# Patient Record
Sex: Male | Born: 1970 | Race: Black or African American | Hispanic: No | Marital: Married | Smoking: Never smoker
Health system: Southern US, Community
[De-identification: ages and names within clinical notes are randomized; demographics above are authoritative.]

## PROBLEM LIST (undated history)

## (undated) DIAGNOSIS — F988 Other specified behavioral and emotional disorders with onset usually occurring in childhood and adolescence: Secondary | ICD-10-CM

## (undated) DIAGNOSIS — R030 Elevated blood-pressure reading, without diagnosis of hypertension: Secondary | ICD-10-CM

## (undated) DIAGNOSIS — R7303 Prediabetes: Secondary | ICD-10-CM

## (undated) HISTORY — DX: Morbid (severe) obesity due to excess calories: E66.01

## (undated) HISTORY — DX: Other specified behavioral and emotional disorders with onset usually occurring in childhood and adolescence: F98.8

## (undated) HISTORY — DX: Prediabetes: R73.03

## (undated) HISTORY — DX: Elevated blood-pressure reading, without diagnosis of hypertension: R03.0

---

## 1998-09-20 ENCOUNTER — Emergency Department (HOSPITAL_COMMUNITY): Admission: EM | Admit: 1998-09-20 | Discharge: 1998-09-20 | Payer: Self-pay | Admitting: Emergency Medicine

## 1998-09-20 ENCOUNTER — Encounter: Payer: Self-pay | Admitting: Emergency Medicine

## 1999-07-28 ENCOUNTER — Encounter: Payer: Self-pay | Admitting: Emergency Medicine

## 1999-07-28 ENCOUNTER — Emergency Department (HOSPITAL_COMMUNITY): Admission: EM | Admit: 1999-07-28 | Discharge: 1999-07-28 | Payer: Self-pay | Admitting: Emergency Medicine

## 2000-03-30 ENCOUNTER — Emergency Department (HOSPITAL_COMMUNITY): Admission: EM | Admit: 2000-03-30 | Discharge: 2000-03-30 | Payer: Self-pay | Admitting: Emergency Medicine

## 2000-03-30 ENCOUNTER — Encounter: Payer: Self-pay | Admitting: Emergency Medicine

## 2004-05-21 ENCOUNTER — Ambulatory Visit (HOSPITAL_COMMUNITY): Admission: RE | Admit: 2004-05-21 | Discharge: 2004-05-21 | Payer: Self-pay | Admitting: Cardiology

## 2004-05-21 ENCOUNTER — Ambulatory Visit: Payer: Self-pay | Admitting: Cardiology

## 2004-05-22 ENCOUNTER — Ambulatory Visit: Payer: Self-pay

## 2004-05-23 ENCOUNTER — Ambulatory Visit: Payer: Self-pay | Admitting: Cardiology

## 2012-03-17 ENCOUNTER — Encounter (HOSPITAL_COMMUNITY): Payer: Self-pay | Admitting: *Deleted

## 2012-03-17 ENCOUNTER — Emergency Department (HOSPITAL_COMMUNITY)
Admission: EM | Admit: 2012-03-17 | Discharge: 2012-03-17 | Disposition: A | Discharge status: Home / Self Care | Payer: No Typology Code available for payment source | Attending: Pediatric Emergency Medicine | Admitting: Pediatric Emergency Medicine

## 2012-03-17 ENCOUNTER — Emergency Department (HOSPITAL_COMMUNITY): Payer: Self-pay

## 2012-03-17 DIAGNOSIS — S239XXA Sprain of unspecified parts of thorax, initial encounter: Secondary | ICD-10-CM | POA: Insufficient documentation

## 2012-03-17 DIAGNOSIS — S161XXA Strain of muscle, fascia and tendon at neck level, initial encounter: Secondary | ICD-10-CM

## 2012-03-17 DIAGNOSIS — Y92410 Unspecified street and highway as the place of occurrence of the external cause: Secondary | ICD-10-CM | POA: Insufficient documentation

## 2012-03-17 DIAGNOSIS — Y9389 Activity, other specified: Secondary | ICD-10-CM | POA: Insufficient documentation

## 2012-03-17 DIAGNOSIS — S139XXA Sprain of joints and ligaments of unspecified parts of neck, initial encounter: Secondary | ICD-10-CM | POA: Insufficient documentation

## 2012-03-17 DIAGNOSIS — S39012A Strain of muscle, fascia and tendon of lower back, initial encounter: Secondary | ICD-10-CM

## 2012-03-17 MED ORDER — CYCLOBENZAPRINE HCL 10 MG PO TABS: 10.0000 mg | ORAL_TABLET | Freq: Two times a day (BID) | ORAL | Status: DC | PRN

## 2012-03-17 MED ORDER — IBUPROFEN 800 MG PO TABS: 800.0000 mg | ORAL_TABLET | Freq: Three times a day (TID) | ORAL | Status: DC

## 2012-03-17 MED ORDER — IBUPROFEN 800 MG PO TABS
800.0000 mg | ORAL_TABLET | Freq: Once | ORAL | Status: AC
  Administered 2012-03-17: 800 mg via ORAL
  Filled 2012-03-17: qty 1
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
## 2012-03-17 NOTE — ED Provider Notes (Signed)
Medical screening examination/treatment/procedure(s) were performed by non-physician practitioner and as supervising physician I was immediately available for consultation/collaboration.    Felina Tello M Montrice Gracey, MD 03/17/12 2008 

## 2012-06-27 NOTE — ED Notes (Signed)
Patient called requesting additional time for work note and was informed that he would not be able to get any more than 3 consecutive days.

## 2013-10-12 ENCOUNTER — Ambulatory Visit: Payer: 59

## 2013-10-12 ENCOUNTER — Ambulatory Visit (INDEPENDENT_AMBULATORY_CARE_PROVIDER_SITE_OTHER): Payer: 59 | Admitting: Family Medicine

## 2013-10-12 VITALS — BP 154/88 | HR 81 | Temp 97.8°F | Resp 18 | Ht 66.0 in | Wt 234.0 lb

## 2013-10-12 DIAGNOSIS — M549 Dorsalgia, unspecified: Secondary | ICD-10-CM

## 2013-10-12 DIAGNOSIS — S39012A Strain of muscle, fascia and tendon of lower back, initial encounter: Secondary | ICD-10-CM

## 2013-10-12 DIAGNOSIS — S161XXA Strain of muscle, fascia and tendon at neck level, initial encounter: Secondary | ICD-10-CM

## 2013-10-12 DIAGNOSIS — M62830 Muscle spasm of back: Secondary | ICD-10-CM

## 2013-10-12 MED ORDER — PREDNISONE 10 MG PO TABS: ORAL_TABLET | ORAL | Status: DC

## 2013-10-12 MED ORDER — CYCLOBENZAPRINE HCL 10 MG PO TABS: 10.0000 mg | ORAL_TABLET | Freq: Three times a day (TID) | ORAL | Status: DC | PRN
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
## 2015-10-25 IMAGING — CR DG LUMBAR SPINE COMPLETE 4+V
5 series · 5 of 5 positions shown · non-contrast
Comparison: none

[AP]
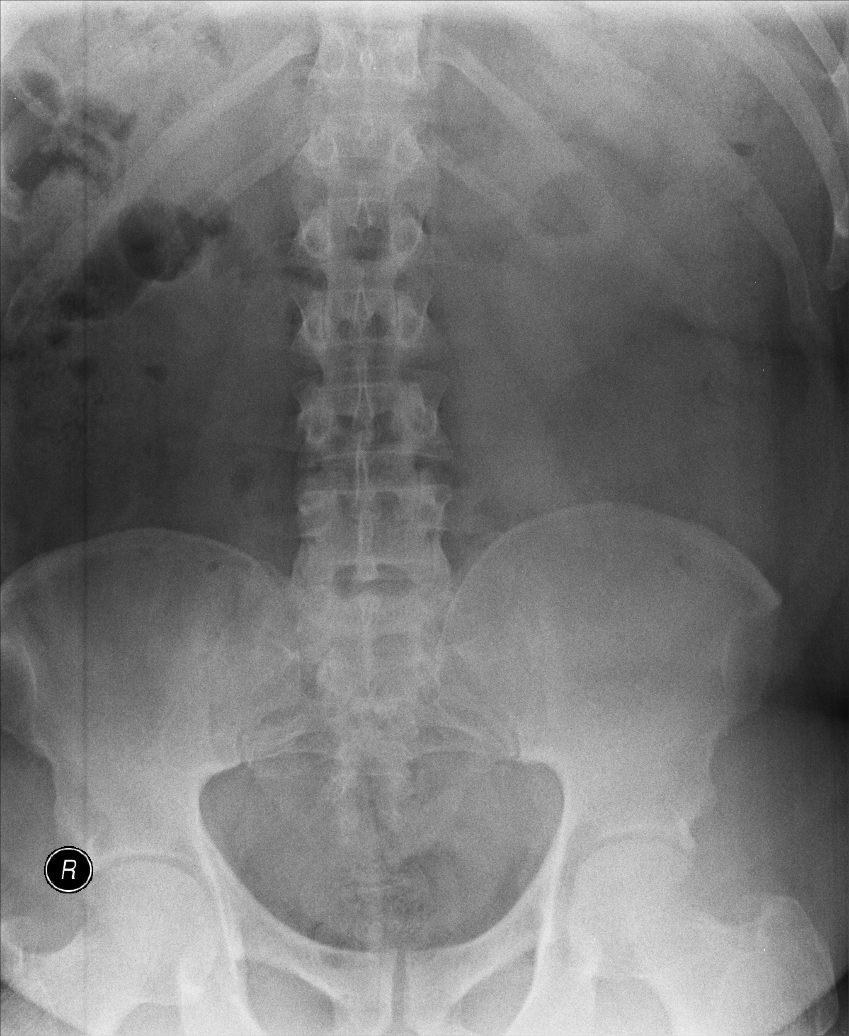

[rpo]
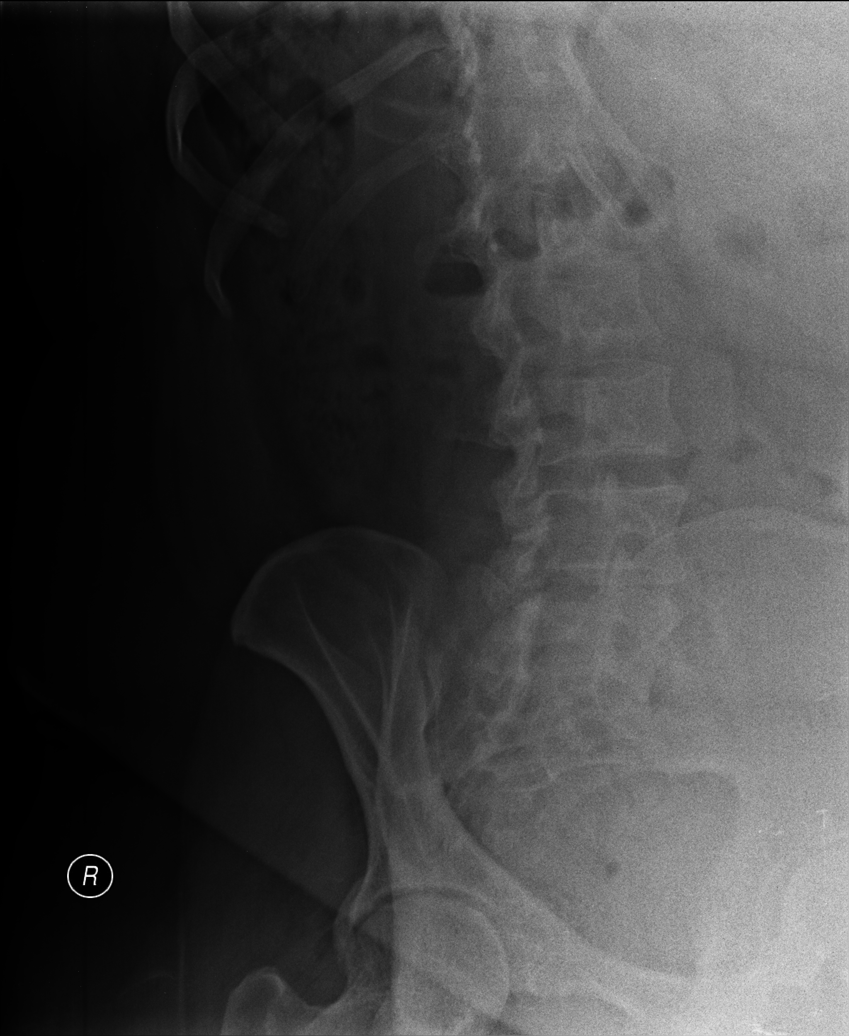

[lpo]
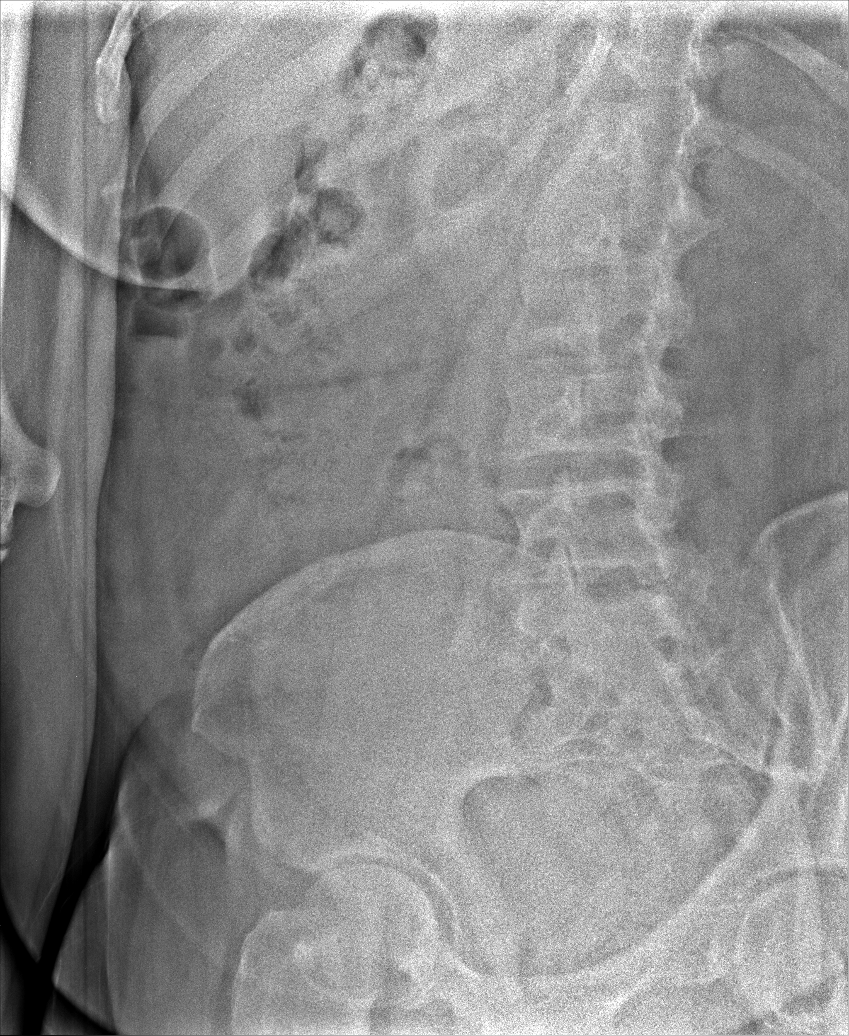

[lateral (1 of 2)]
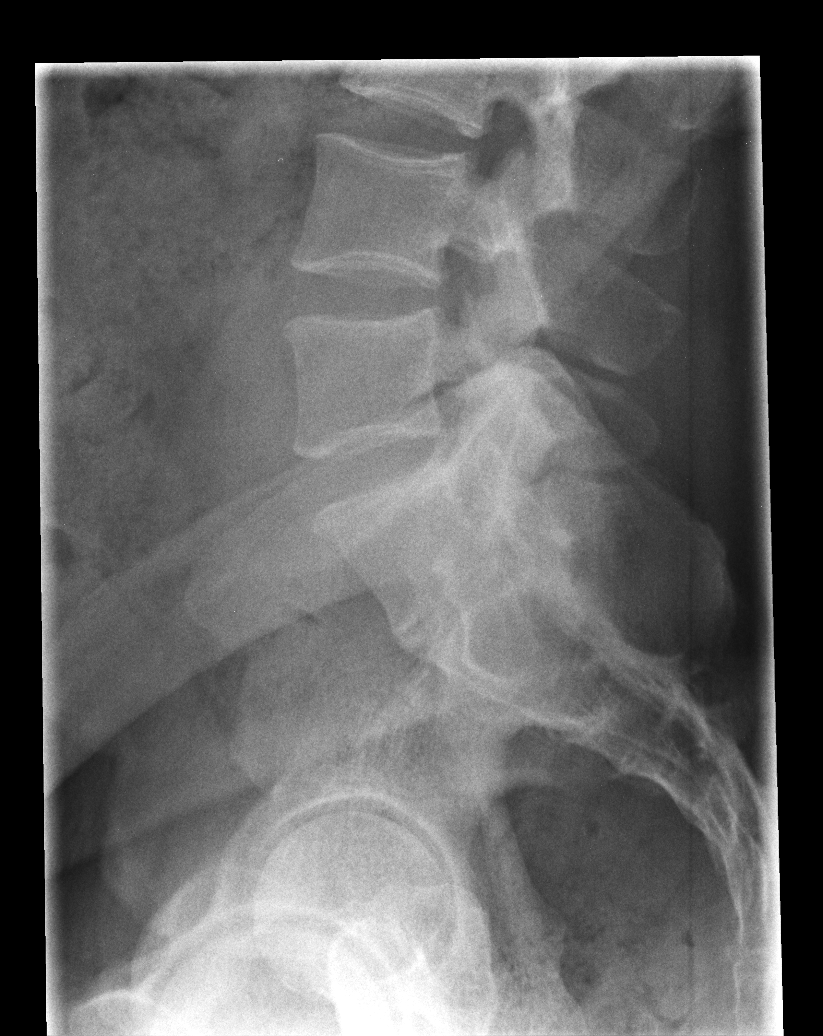

[lateral (2 of 2)]
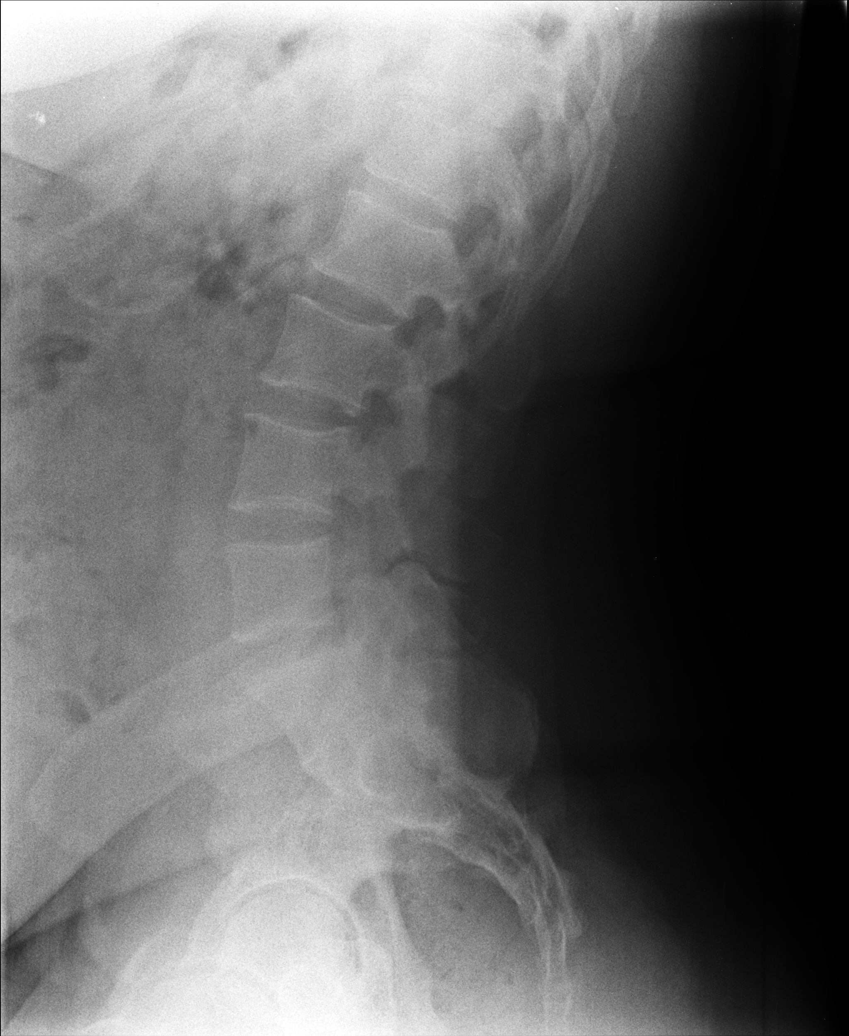

[5 of 5 positions shown; findings below may reference images not displayed]

Canned report from images found in remote index.

Refer to host system for actual result text.

## 2016-12-02 ENCOUNTER — Other Ambulatory Visit: Payer: 59

## 2016-12-04 ENCOUNTER — Encounter: Payer: Self-pay | Admitting: Family Medicine

## 2016-12-04 ENCOUNTER — Ambulatory Visit (INDEPENDENT_AMBULATORY_CARE_PROVIDER_SITE_OTHER): Payer: 59 | Admitting: Family Medicine

## 2016-12-04 VITALS — BP 136/92 | HR 83 | Temp 98.8°F | Resp 16 | Ht 67.5 in | Wt 266.5 lb

## 2016-12-04 DIAGNOSIS — R030 Elevated blood-pressure reading, without diagnosis of hypertension: Secondary | ICD-10-CM

## 2016-12-04 DIAGNOSIS — Z0000 Encounter for general adult medical examination without abnormal findings: Secondary | ICD-10-CM

## 2016-12-04 LAB — COMPREHENSIVE METABOLIC PANEL
ALBUMIN: 4.4 g/dL (ref 3.5–5.2)
ALT: 22 U/L (ref 0–53)
AST: 15 U/L (ref 0–37)
Alkaline Phosphatase: 61 U/L (ref 39–117)
BUN: 18 mg/dL (ref 6–23)
CALCIUM: 9.9 mg/dL (ref 8.4–10.5)
CHLORIDE: 104 meq/L (ref 96–112)
CO2: 32 meq/L (ref 19–32)
CREATININE: 1.33 mg/dL (ref 0.40–1.50)
GFR: 74.4 mL/min (ref >60.00)
Glucose, Bld: 104 mg/dL — ABNORMAL HIGH (ref 70–99)
POTASSIUM: 4.3 meq/L (ref 3.5–5.1)
Sodium: 143 mEq/L (ref 135–145)
Total Bilirubin: 0.3 mg/dL (ref 0.2–1.2)
Total Protein: 7.2 g/dL (ref 6.0–8.3)

## 2016-12-04 LAB — CBC WITH DIFFERENTIAL/PLATELET
BASOS ABS: 0 10*3/uL (ref 0.0–0.1)
Basophils Relative: 0.6 % (ref 0.0–3.0)
EOS ABS: 0.2 10*3/uL (ref 0.0–0.7)
Eosinophils Relative: 2.3 % (ref 0.0–5.0)
HCT: 44.4 % (ref 39.0–52.0)
Hemoglobin: 14.6 g/dL (ref 13.0–17.0)
LYMPHS ABS: 2 10*3/uL (ref 0.7–4.0)
Lymphocytes Relative: 28.8 % (ref 12.0–46.0)
MCHC: 32.9 g/dL (ref 30.0–36.0)
MCV: 87.4 fl (ref 78.0–100.0)
MONO ABS: 0.5 10*3/uL (ref 0.1–1.0)
MONOS PCT: 7.1 % (ref 3.0–12.0)
Neutro Abs: 4.3 10*3/uL (ref 1.4–7.7)
Neutrophils Relative %: 61.2 % (ref 43.0–77.0)
Platelets: 264 10*3/uL (ref 150.0–400.0)
RBC: 5.08 Mil/uL (ref 4.22–5.81)
RDW: 14.1 % (ref 11.5–15.5)
WBC: 7 10*3/uL (ref 4.0–10.5)

## 2016-12-04 LAB — TSH: TSH: 1.87 u[IU]/mL (ref 0.35–4.50)

## 2016-12-04 NOTE — Progress Notes (Signed)
Office Note 12/04/2016  CC:  Chief Complaint  Patient presents with  . Establish Care    no recent PCP, goes to urgent cares if sick  . Annual Exam    pt is not fasting.    HPI:  Cameron Mccoy is a 46 y.o. male who is here to establish care Patient's most recent primary MD: none Old records were not reviewed prior to or during today's visit.  Dental: preventatives UTD. Eye exam every other year. Exercise: just started 2 weeks ago; 30-45 min cardio + wt training. Diet: cutting carbs and salt and simple carbs lately.    Past Medical History:  Diagnosis Date  . Attention deficit disorder (ADD) in adult    was on vyvanse for 3 yrs, stopped approx 2016.  [facility]    History reviewed. No pertinent surgical history.  Family History  Problem Relation Age of Onset  . Diabetes Mother   . Diabetes Father   . Heart disease Maternal Grandmother   . Hypertension Maternal Grandmother     Social History   Social History  . Marital status: Married    Spouse name: N/A  . Number of children: N/A  . Years of education: N/A   Occupational History  . Not on file.   Social History Main Topics  . Smoking status: Never Smoker  . Smokeless tobacco: Never Used  . Alcohol use No  . Drug use: No  . Sexual activity: Not on file   Other Topics Concern  . Not on file   Social History Narrative   Married, has 2 boys and 1 girl.   Educ: BS   [place] from [place], [place] around age 89.   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   No T/A/Ds.   MEDS; none  No Known Allergies  ROS Review of Systems  Constitutional: Negative for appetite change, chills, fatigue and fever.  HENT: Negative for congestion, dental problem, ear pain and sore throat.   Eyes: Negative for discharge, redness and visual disturbance.  Respiratory: Negative for cough, chest tightness, shortness of breath and wheezing.   Cardiovascular: Negative  for chest pain, palpitations and leg swelling.  Gastrointestinal: Negative for abdominal pain, blood in stool, diarrhea, nausea and vomiting.  Genitourinary: Negative for difficulty urinating, dysuria, flank pain, frequency, hematuria and urgency.  Musculoskeletal: Negative for arthralgias, back pain, joint swelling, myalgias and neck stiffness.  Skin: Negative for pallor and rash.  Neurological: Negative for dizziness, speech difficulty, weakness and headaches.  Hematological: Negative for adenopathy. Does not bruise/bleed easily.  Psychiatric/Behavioral: Negative for confusion and sleep disturbance. The patient is not nervous/anxious.     PE;Initial bp 130/80 Blood pressure (!) 136/92, pulse 83, temperature 98.8 F (37.1 C), temperature source Oral, resp. rate 16, height 5' 7.5" (1.715 m), weight 266 lb 8 oz (120.9 kg), SpO2 93 %. Body mass index is 41.12 kg/m.  Gen: Alert, well appearing.  Patient is oriented to person, place, time, and situation. AFFECT: pleasant, lucid thought and speech. ENT: Ears: EACs clear, normal epithelium.  TMs with good light reflex and landmarks bilaterally.  Eyes: no injection, icteris, swelling, or exudate.  EOMI, PERRLA. Nose: no drainage or turbinate edema/swelling.  No injection or focal lesion.  Mouth: lips without lesion/swelling.  Oral mucosa pink and moist.  Dentition intact and without obvious caries or gingival swelling.  Oropharynx without erythema, exudate, or swelling.  Neck: supple/nontender.  No LAD, mass, or TM.  Carotid  pulses 2+ bilaterally, without bruits. CV: RRR, no m/r/g.   LUNGS: CTA bilat, nonlabored resps, good aeration in all lung fields. ABD: soft, NT, ND, BS normal.  No hepatospenomegaly or mass.  No bruits. EXT: no clubbing or cyanosis.  He has 1-2 + pitting edema in both LL's.  Musculoskeletal: no joint swelling, erythema, warmth, or tenderness.  ROM of all joints intact. Skin - no sores or suspicious lesions or rashes or color  changes  Pertinent labs:  none  ASSESSMENT AND PLAN:   New pt; no old records to obtain.  Health maintenance exam: Reviewed age and gender appropriate health maintenance issues (prudent diet, regular exercise, health risks of tobacco and excessive alcohol, use of seatbelts, fire alarms in home, use of sunscreen).  Also reviewed age and gender appropriate health screening as well as vaccine recommendations. He declined Tdap today. He chose to get his CMET, CBC, and TSH today, but wants to return next week for lab visit when fasting to get a fasting glucose and FLP. Mild bp elevation today.  Instructions: Buy a blood pressure cuff (upper arm) and check bp 3-4 days a week, make follow up appointment if blood pressure is not consistently <130/80.  An After Visit Summary was printed and given to the patient.  Return in about 1 year (around 12/04/2017) for annual CPE (fasting).  Signed:  Santiago BumpersPhil Addelyn Alleman, MD           12/04/2016

## 2016-12-04 NOTE — Patient Instructions (Signed)
Buy a blood pressure cuff (upper arm) and check bp 3-4 days a week, make follow up appointment if blood pressure is not consistently <130/80.  Health Maintenance, Male A healthy lifestyle and preventive care is important for your health and wellness. Ask your health care provider about what schedule of regular examinations is right for you. What should I know about weight and diet? Eat a Healthy Diet  Eat plenty of vegetables, fruits, whole grains, low-fat dairy products, and lean protein.  Do not eat a lot of foods high in solid fats, added sugars, or salt.  Maintain a Healthy Weight Regular exercise can help you achieve or maintain a healthy weight. You should:  Do at least 150 minutes of exercise each week. The exercise should increase your heart rate and make you sweat (moderate-intensity exercise).  Do strength-training exercises at least twice a week.  Watch Your Levels of Cholesterol and Blood Lipids  Have your blood tested for lipids and cholesterol every 5 years starting at 46 years of age. If you are at high risk for heart disease, you should start having your blood tested when you are 46 years old. You may need to have your cholesterol levels checked more often if: ? Your lipid or cholesterol levels are high. ? You are older than 46 years of age. ? You are at high risk for heart disease.  What should I know about cancer screening? Many types of cancers can be detected early and may often be prevented. Lung Cancer  You should be screened every year for lung cancer if: ? You are a current smoker who has smoked for at least 30 years. ? You are a former smoker who has quit within the past 15 years.  Talk to your health care provider about your screening options, when you should start screening, and how often you should be screened.  Colorectal Cancer  Routine colorectal cancer screening usually begins at 10050 years of age and should be repeated every 5-10 years until you are  46 years old. You may need to be screened more often if early forms of precancerous polyps or small growths are found. Your health care provider may recommend screening at an earlier age if you have risk factors for colon cancer.  Your health care provider may recommend using home test kits to check for hidden blood in the stool.  A small camera at the end of a tube can be used to examine your colon (sigmoidoscopy or colonoscopy). This checks for the earliest forms of colorectal cancer.  Prostate and Testicular Cancer  Depending on your age and overall health, your health care provider may do certain tests to screen for prostate and testicular cancer.  Talk to your health care provider about any symptoms or concerns you have about testicular or prostate cancer.  Skin Cancer  Check your skin from head to toe regularly.  Tell your health care provider about any new moles or changes in moles, especially if: ? There is a change in a mole's size, shape, or color. ? You have a mole that is larger than a pencil eraser.  Always use sunscreen. Apply sunscreen liberally and repeat throughout the day.  Protect yourself by wearing long sleeves, pants, a wide-brimmed hat, and sunglasses when outside.  What should I know about heart disease, diabetes, and high blood pressure?  If you are 3318-46 years of age, have your blood pressure checked every 3-5 years. If you are 46 years of age or older,  have your blood pressure checked every year. You should have your blood pressure measured twice-once when you are at a hospital or clinic, and once when you are not at a hospital or clinic. Record the average of the two measurements. To check your blood pressure when you are not at a hospital or clinic, you can use: ? An automated blood pressure machine at a pharmacy. ? A home blood pressure monitor.  Talk to your health care provider about your target blood pressure.  If you are between 18-43 years old, ask  your health care provider if you should take aspirin to prevent heart disease.  Have regular diabetes screenings by checking your fasting blood sugar level. ? If you are at a normal weight and have a low risk for diabetes, have this test once every three years after the age of 79. ? If you are overweight and have a high risk for diabetes, consider being tested at a younger age or more often.  A one-time screening for abdominal aortic aneurysm (AAA) by ultrasound is recommended for men aged 70-75 years who are current or former smokers. What should I know about preventing infection? Hepatitis B If you have a higher risk for hepatitis B, you should be screened for this virus. Talk with your health care provider to find out if you are at risk for hepatitis B infection. Hepatitis C Blood testing is recommended for:  Everyone born from [birth year] through 1965.  Anyone with known risk factors for hepatitis C.  Sexually Transmitted Diseases (STDs)  You should be screened each year for STDs including gonorrhea and chlamydia if: ? You are sexually active and are younger than 46 years of age. ? You are older than 46 years of age and your health care provider tells you that you are at risk for this type of infection. ? Your sexual activity has changed since you were last screened and you are at an increased risk for chlamydia or gonorrhea. Ask your health care provider if you are at risk.  Talk with your health care provider about whether you are at high risk of being infected with HIV. Your health care provider may recommend a prescription medicine to help prevent HIV infection.  What else can I do?  Schedule regular health, dental, and eye exams.  Stay current with your vaccines (immunizations).  Do not use any tobacco products, such as cigarettes, chewing tobacco, and e-cigarettes. If you need help quitting, ask your health care provider.  Limit alcohol intake to no more than 2 drinks per day.  One drink equals 12 ounces of beer, 5 ounces of wine, or 1 ounces of hard liquor.  Do not use street drugs.  Do not share needles.  Ask your health care provider for help if you need support or information about quitting drugs.  Tell your health care provider if you often feel depressed.  Tell your health care provider if you have ever been abused or do not feel safe at home. This information is not intended to replace advice given to you by your health care provider. Make sure you discuss any questions you have with your health care provider. Document Released: 10/24/2007 Document Revised: 12/25/2015 Document Reviewed: 01/29/2015 Elsevier Interactive Patient Education  [employer].

## 2016-12-09 ENCOUNTER — Other Ambulatory Visit: Payer: 59

## 2016-12-09 DIAGNOSIS — R7303 Prediabetes: Secondary | ICD-10-CM

## 2016-12-09 HISTORY — DX: Prediabetes: R73.03

## 2016-12-10 ENCOUNTER — Encounter: Payer: Self-pay | Admitting: Family Medicine

## 2016-12-10 ENCOUNTER — Other Ambulatory Visit (INDEPENDENT_AMBULATORY_CARE_PROVIDER_SITE_OTHER): Payer: 59

## 2016-12-10 DIAGNOSIS — Z0000 Encounter for general adult medical examination without abnormal findings: Secondary | ICD-10-CM | POA: Diagnosis not present

## 2016-12-10 LAB — LIPID PANEL
CHOL/HDL RATIO: 4
CHOLESTEROL: 180 mg/dL (ref 0–200)
HDL: 41.7 mg/dL (ref >39.00)
LDL CALC: 127 mg/dL — AB (ref 0–99)
NonHDL: 138.74
TRIGLYCERIDES: 60 mg/dL (ref 0.0–149.0)
VLDL: 12 mg/dL (ref 0.0–40.0)

## 2016-12-10 LAB — GLUCOSE, RANDOM: Glucose, Bld: 121 mg/dL — ABNORMAL HIGH (ref 70–99)

## 2016-12-11 ENCOUNTER — Encounter: Payer: Self-pay | Admitting: Family Medicine

## 2016-12-11 ENCOUNTER — Other Ambulatory Visit (INDEPENDENT_AMBULATORY_CARE_PROVIDER_SITE_OTHER): Payer: 59

## 2016-12-11 ENCOUNTER — Other Ambulatory Visit: Payer: Self-pay | Admitting: Family Medicine

## 2016-12-11 DIAGNOSIS — R7301 Impaired fasting glucose: Secondary | ICD-10-CM

## 2016-12-11 DIAGNOSIS — R7303 Prediabetes: Secondary | ICD-10-CM

## 2016-12-11 LAB — HEMOGLOBIN A1C: Hgb A1c MFr Bld: 6.2 % (ref 4.6–6.5)

## 2016-12-18 DIAGNOSIS — Z79899 Other long term (current) drug therapy: Secondary | ICD-10-CM | POA: Diagnosis not present

## 2016-12-24 ENCOUNTER — Ambulatory Visit: Payer: 59 | Admitting: Family Medicine

## 2016-12-24 NOTE — Progress Notes (Deleted)
OFFICE VISIT  12/24/2016   CC: No chief complaint on file.    HPI:    Patient is a 46 y.o.  male who presents for ear complaint.  Past Medical History:  Diagnosis Date  . Attention deficit disorder (ADD) in adult    was on vyvanse for 3 yrs, stopped approx 2016.  [facility]  . Elevated blood pressure reading in office without diagnosis of hypertension   . Morbid obesity (HCC)   . Prediabetes 12/2016   Fasting gluc @120  x 2, A1c 6.2%.--nutritionist consult recommended    No past surgical history on file.  No outpatient prescriptions prior to visit.   No facility-administered medications prior to visit.     No Known Allergies  ROS As per HPI  PE: There were no vitals taken for this visit. ***  LABS:  Lab Results  Component Value Date   WBC 7.0 12/04/2016   HGB 14.6 12/04/2016   HCT 44.4 12/04/2016   MCV 87.4 12/04/2016   PLT 264.0 12/04/2016     Chemistry      Component Value Date/Time   NA 143 12/04/2016 1424   K 4.3 12/04/2016 1424   CL 104 12/04/2016 1424   CO2 32 12/04/2016 1424   BUN 18 12/04/2016 1424   CREATININE 1.33 12/04/2016 1424      Component Value Date/Time   CALCIUM 9.9 12/04/2016 1424   ALKPHOS 61 12/04/2016 1424   AST 15 12/04/2016 1424   ALT 22 12/04/2016 1424   BILITOT 0.3 12/04/2016 1424       IMPRESSION AND PLAN:  No problem-specific Assessment & Plan notes found for this encounter.   FOLLOW UP: No Follow-up on file.

## 2017-01-27 ENCOUNTER — Ambulatory Visit: Payer: 59 | Admitting: Registered"

## 2017-03-15 DIAGNOSIS — Z79899 Other long term (current) drug therapy: Secondary | ICD-10-CM | POA: Diagnosis not present

## 2017-05-20 ENCOUNTER — Encounter (HOSPITAL_COMMUNITY): Payer: Self-pay

## 2017-05-20 ENCOUNTER — Other Ambulatory Visit: Payer: Self-pay

## 2017-05-20 ENCOUNTER — Emergency Department (HOSPITAL_COMMUNITY)
Admission: EM | Admit: 2017-05-20 | Discharge: 2017-05-20 | Disposition: A | Discharge status: Home / Self Care | Payer: 59 | Attending: Emergency Medicine | Admitting: Emergency Medicine

## 2017-05-20 DIAGNOSIS — R0789 Other chest pain: Secondary | ICD-10-CM | POA: Insufficient documentation

## 2017-05-20 DIAGNOSIS — Y92410 Unspecified street and highway as the place of occurrence of the external cause: Secondary | ICD-10-CM | POA: Diagnosis not present

## 2017-05-20 DIAGNOSIS — Y999 Unspecified external cause status: Secondary | ICD-10-CM | POA: Diagnosis not present

## 2017-05-20 DIAGNOSIS — M25512 Pain in left shoulder: Secondary | ICD-10-CM | POA: Diagnosis not present

## 2017-05-20 DIAGNOSIS — S1980XA Other specified injuries of unspecified part of neck, initial encounter: Secondary | ICD-10-CM | POA: Diagnosis present

## 2017-05-20 DIAGNOSIS — S161XXA Strain of muscle, fascia and tendon at neck level, initial encounter: Secondary | ICD-10-CM | POA: Insufficient documentation

## 2017-05-20 DIAGNOSIS — Y9389 Activity, other specified: Secondary | ICD-10-CM | POA: Diagnosis not present

## 2017-05-20 MED ORDER — IBUPROFEN 600 MG PO TABS: 600.0000 mg | ORAL_TABLET | Freq: Three times a day (TID) | ORAL | 0 refills | Status: DC | PRN

## 2017-05-20 MED ORDER — IBUPROFEN 200 MG PO TABS: 600.0000 mg | ORAL_TABLET | Freq: Once | ORAL | Status: DC

## 2017-05-20 MED ORDER — METHOCARBAMOL 500 MG PO TABS: 500.0000 mg | ORAL_TABLET | Freq: Three times a day (TID) | ORAL | 0 refills | Status: DC | PRN

## 2017-05-20 MED ORDER — METHOCARBAMOL 500 MG PO TABS: 500.0000 mg | ORAL_TABLET | Freq: Once | ORAL | Status: DC
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
## 2017-06-11 ENCOUNTER — Encounter: Payer: Self-pay | Admitting: Family Medicine

## 2017-06-11 ENCOUNTER — Ambulatory Visit: Payer: 59 | Admitting: Family Medicine

## 2017-06-11 VITALS — BP 144/96 | HR 92 | Temp 98.2°F | Resp 20 | Wt 259.5 lb

## 2017-06-11 DIAGNOSIS — Z0289 Encounter for other administrative examinations: Secondary | ICD-10-CM

## 2017-06-11 DIAGNOSIS — R030 Elevated blood-pressure reading, without diagnosis of hypertension: Secondary | ICD-10-CM

## 2017-06-11 DIAGNOSIS — M25512 Pain in left shoulder: Secondary | ICD-10-CM

## 2017-06-11 MED ORDER — METHYLPREDNISOLONE ACETATE 80 MG/ML IJ SUSP
80.0000 mg | Freq: Once | INTRAMUSCULAR | Status: AC
  Administered 2017-06-11: 80 mg via INTRAMUSCULAR

## 2017-06-11 MED ORDER — PREDNISONE 20 MG PO TABS: ORAL_TABLET | ORAL | 0 refills | Status: DC

## 2017-06-11 MED ORDER — CYCLOBENZAPRINE HCL 10 MG PO TABS: 10.0000 mg | ORAL_TABLET | Freq: Two times a day (BID) | ORAL | 0 refills | Status: DC | PRN
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
## 2017-06-11 NOTE — Patient Instructions (Signed)
Please have xray completed at our [facility] office today.  Once I get those results from the radiologist I will call you with further plan.  Start Flexeril (muscle relaxer) at night, and if not driving and needed you can take one other time in the day as well.   Steroid injection today, start prednisone taper (pills)  tomorrow.  Followup with your PCP in 2 weeks for this condition and your elevated BP.

## 2017-12-06 ENCOUNTER — Encounter: Payer: 59 | Admitting: Family Medicine

## 2017-12-06 ENCOUNTER — Encounter: Payer: Self-pay | Admitting: Family Medicine

## 2017-12-06 NOTE — Progress Notes (Deleted)
Office Note 12/06/2017  CC: No chief complaint on file.   HPI:  Cameron Mccoy is a 47 y.o. male who is here for annual health maintenance exam.   Past Medical History:  Diagnosis Date  . Attention deficit disorder (ADD) in adult    was on vyvanse for 3 yrs, stopped approx 2016.  [facility]on [facility]  . Elevated blood pressure reading in office without diagnosis of hypertension   . Morbid obesity (HCC)   . Prediabetes 12/2016   Fasting gluc @120  x 2, A1c 6.2%.--nutritionist consult recommended    No past surgical history on file.  Family History  Problem Relation Age of Onset  . Diabetes Mother   . Diabetes Father   . Heart disease Maternal Grandmother   . Hypertension Maternal Grandmother     Social History   Socioeconomic History  . Marital status: Married    Spouse name: Not on file  . Number of children: Not on file  . Years of education: Not on file  . Highest education level: Not on file  Occupational History  . Not on file  Social Needs  . Financial resource strain: Not on file  . Food insecurity:    Worry: Not on file    Inability: Not on file  . Transportation needs:    Medical: Not on file    Non-medical: Not on file  Tobacco Use  . Smoking status: Never Smoker  . Smokeless tobacco: Never Used  Substance and Sexual Activity  . Alcohol use: No  . Drug use: No  . Sexual activity: Not on file  Lifestyle  . Physical activity:    Days per week: Not on file    Minutes per session: Not on file  . Stress: Not on file  Relationships  . Social connections:    Talks on phone: Not on file    Gets together: Not on file    Attends religious service: Not on file    Active member of club or organization: Not on file    Attends meetings of clubs or organizations: Not on file    Relationship status: Not on file  . Intimate partner violence:    Fear of current or ex partner: Not on file    Emotionally abused: Not on file     Physically abused: Not on file    Forced sexual activity: Not on file  Other Topics Concern  . Not on file  Social History Narrative   Married, has 2 boys and 1 girl.   Educ: BS   [place] from [place], [place] around age 47.   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   No T/A/Ds.    Outpatient Medications Prior to Visit  Medication Sig Dispense Refill  . cyclobenzaprine (FLEXERIL) 10 MG tablet Take 1 tablet (10 mg total) by mouth 2 (two) times daily as needed for muscle spasms. 30 tablet 0  . ibuprofen (ADVIL,MOTRIN) 600 MG tablet Take 1 tablet (600 mg total) by mouth every 8 (eight) hours as needed. 15 tablet 0  . methocarbamol (ROBAXIN) 500 MG tablet Take 1 tablet (500 mg total) by mouth every 8 (eight) hours as needed for muscle spasms. 12 tablet 0  . predniSONE (DELTASONE) 20 MG tablet 60 mg x3d, 40 mg x3d, 20 mg x2d, 10 mg x2d 18 tablet 0  . VYVANSE 30 MG capsule      No facility-administered medications prior to visit.     No  Known Allergies  ROS *** PE; There were no vitals taken for this visit. *** Pertinent labs:  Lab Results  Component Value Date   TSH 1.87 12/04/2016   Lab Results  Component Value Date   WBC 7.0 12/04/2016   HGB 14.6 12/04/2016   HCT 44.4 12/04/2016   MCV 87.4 12/04/2016   PLT 264.0 12/04/2016   Lab Results  Component Value Date   CREATININE 1.33 12/04/2016   BUN 18 12/04/2016   NA 143 12/04/2016   K 4.3 12/04/2016   CL 104 12/04/2016   CO2 32 12/04/2016   Lab Results  Component Value Date   ALT 22 12/04/2016   AST 15 12/04/2016   ALKPHOS 61 12/04/2016   BILITOT 0.3 12/04/2016   Lab Results  Component Value Date   CHOL 180 12/10/2016   Lab Results  Component Value Date   HDL 41.70 12/10/2016   Lab Results  Component Value Date   LDLCALC 127 (H) 12/10/2016   Lab Results  Component Value Date   TRIG 60.0 12/10/2016   Lab Results  Component Value Date   CHOLHDL 4 12/10/2016   Lab Results   Component Value Date   HGBA1C 6.2 12/11/2016    ASSESSMENT AND PLAN:   Health maintenance exam: Reviewed age and gender appropriate health maintenance issues (prudent diet, regular exercise, health risks of tobacco and excessive alcohol, use of seatbelts, fire alarms in home, use of sunscreen).  Also reviewed age and gender appropriate health screening as well as vaccine recommendations. Vaccines: Tdap due-- Labs: Fasting HP, A1c (prediabetes). Prostate ca screening: average risk patient= start screening at age 52 yrs. Colon ca screening: average risk patient= start screening at age 49 yrs.  An After Visit Summary was printed and given to the patient.  FOLLOW UP:  No follow-ups on file.  Signed:  Santiago Bumpers, MD           12/06/2017

## 2017-12-14 ENCOUNTER — Encounter: Payer: 59 | Admitting: Family Medicine

## 2017-12-15 ENCOUNTER — Encounter: Payer: Self-pay | Admitting: Gastroenterology

## 2017-12-15 ENCOUNTER — Ambulatory Visit (INDEPENDENT_AMBULATORY_CARE_PROVIDER_SITE_OTHER): Payer: 59 | Admitting: Family Medicine

## 2017-12-15 ENCOUNTER — Encounter: Payer: Self-pay | Admitting: Family Medicine

## 2017-12-15 VITALS — BP 130/83 | HR 83 | Temp 98.2°F | Resp 16 | Ht 67.5 in | Wt 272.4 lb

## 2017-12-15 DIAGNOSIS — R7303 Prediabetes: Secondary | ICD-10-CM | POA: Diagnosis not present

## 2017-12-15 DIAGNOSIS — Z1211 Encounter for screening for malignant neoplasm of colon: Secondary | ICD-10-CM

## 2017-12-15 DIAGNOSIS — Z23 Encounter for immunization: Secondary | ICD-10-CM | POA: Diagnosis not present

## 2017-12-15 DIAGNOSIS — Z0000 Encounter for general adult medical examination without abnormal findings: Secondary | ICD-10-CM

## 2017-12-15 LAB — CBC WITH DIFFERENTIAL/PLATELET
BASOS PCT: 1.3 % (ref 0.0–3.0)
Basophils Absolute: 0.1 10*3/uL (ref 0.0–0.1)
EOS PCT: 2 % (ref 0.0–5.0)
Eosinophils Absolute: 0.1 10*3/uL (ref 0.0–0.7)
HCT: 42.6 % (ref 39.0–52.0)
HEMOGLOBIN: 14.1 g/dL (ref 13.0–17.0)
Lymphocytes Relative: 33.2 % (ref 12.0–46.0)
Lymphs Abs: 1.7 10*3/uL (ref 0.7–4.0)
MCHC: 33.1 g/dL (ref 30.0–36.0)
MCV: 85.7 fl (ref 78.0–100.0)
MONO ABS: 0.3 10*3/uL (ref 0.1–1.0)
Monocytes Relative: 6.5 % (ref 3.0–12.0)
Neutro Abs: 2.9 10*3/uL (ref 1.4–7.7)
Neutrophils Relative %: 57 % (ref 43.0–77.0)
Platelets: 268 10*3/uL (ref 150.0–400.0)
RBC: 4.97 Mil/uL (ref 4.22–5.81)
RDW: 14 % (ref 11.5–15.5)
WBC: 5.1 10*3/uL (ref 4.0–10.5)

## 2017-12-15 LAB — LIPID PANEL
CHOL/HDL RATIO: 4
Cholesterol: 175 mg/dL (ref 0–200)
HDL: 43.2 mg/dL (ref >39.00)
LDL Cholesterol: 120 mg/dL — ABNORMAL HIGH (ref 0–99)
NONHDL: 131.81
Triglycerides: 60 mg/dL (ref 0.0–149.0)
VLDL: 12 mg/dL (ref 0.0–40.0)

## 2017-12-15 LAB — COMPREHENSIVE METABOLIC PANEL
ALT: 36 U/L (ref 0–53)
AST: 19 U/L (ref 0–37)
Albumin: 4.3 g/dL (ref 3.5–5.2)
Alkaline Phosphatase: 55 U/L (ref 39–117)
BUN: 16 mg/dL (ref 6–23)
CHLORIDE: 103 meq/L (ref 96–112)
CO2: 32 meq/L (ref 19–32)
Calcium: 9.2 mg/dL (ref 8.4–10.5)
Creatinine, Ser: 1.04 mg/dL (ref 0.40–1.50)
GFR: 98.38 mL/min (ref >60.00)
GLUCOSE: 125 mg/dL — AB (ref 70–99)
POTASSIUM: 3.8 meq/L (ref 3.5–5.1)
Sodium: 140 mEq/L (ref 135–145)
Total Bilirubin: 0.4 mg/dL (ref 0.2–1.2)
Total Protein: 6.8 g/dL (ref 6.0–8.3)

## 2017-12-15 LAB — HEMOGLOBIN A1C: HEMOGLOBIN A1C: 6.5 % (ref 4.6–6.5)

## 2017-12-15 LAB — TSH: TSH: 1.7 u[IU]/mL (ref 0.35–4.50)

## 2017-12-15 NOTE — Patient Instructions (Signed)

## 2017-12-15 NOTE — Progress Notes (Signed)
Office Note 12/15/2017  CC:  Chief Complaint  Patient presents with  . Annual Exam    Pt is fasting.     HPI:  Cameron Mccoy is a 47 y.o. male who is here for annual health maintenance exam. He never got a chance to see the nutritionist.  Started working out about 3 weeks ago. Diet: trying to cut back on calories/portion size Dental UTD. Eye exam 1 yr ago.  Of note, he recently took himself off of vyvanse b/c he wants to see if he can manage w/out meds.  Says he feels very good.  Past Medical History:  Diagnosis Date  . Attention deficit disorder (ADD) in adult    was on vyvanse for 3 yrs, stopped approx 2016.  Dr. Elisabeth Most at [facility].  . Elevated blood pressure reading in office without diagnosis of hypertension   . Morbid obesity (HCC)   . Prediabetes 12/2016   Fasting gluc @120  x 2, A1c 6.2%.--nutritionist consult recommended    History reviewed. No pertinent surgical history.  Family History  Problem Relation Age of Onset  . Diabetes Mother   . Diabetes Father   . Heart disease Maternal Grandmother   . Hypertension Maternal Grandmother     Social History   Socioeconomic History  . Marital status: Married    Spouse name: Not on file  . Number of children: Not on file  . Years of education: Not on file  . Highest education level: Not on file  Occupational History  . Not on file  Social Needs  . Financial resource strain: Not on file  . Food insecurity:    Worry: Not on file    Inability: Not on file  . Transportation needs:    Medical: Not on file    Non-medical: Not on file  Tobacco Use  . Smoking status: Never Smoker  . Smokeless tobacco: Never Used  Substance and Sexual Activity  . Alcohol use: No  . Drug use: No  . Sexual activity: Not on file  Lifestyle  . Physical activity:    Days per week: Not on file    Minutes per session: Not on file  . Stress: Not on file  Relationships  . Social connections:    Talks  on phone: Not on file    Gets together: Not on file    Attends religious service: Not on file    Active member of club or organization: Not on file    Attends meetings of clubs or organizations: Not on file    Relationship status: Not on file  . Intimate partner violence:    Fear of current or ex partner: Not on file    Emotionally abused: Not on file    Physically abused: Not on file    Forced sexual activity: Not on file  Other Topics Concern  . Not on file  Social History Narrative   Married, has 2 boys and 1 girl.   Educ: BS   Orig from [place], moved to [place] around age 23.   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   No T/A/Ds.    Outpatient Medications Prior to Visit  Medication Sig Dispense Refill  . cyclobenzaprine (FLEXERIL) 10 MG tablet Take 1 tablet (10 mg total) by mouth 2 (two) times daily as needed for muscle spasms. (Patient not taking: Reported on 12/15/2017) 30 tablet 0  . ibuprofen (ADVIL,MOTRIN) 600 MG tablet Take 1 tablet (600 mg total) by mouth every 8 (  eight) hours as needed. (Patient not taking: Reported on 12/15/2017) 15 tablet 0  . methocarbamol (ROBAXIN) 500 MG tablet Take 1 tablet (500 mg total) by mouth every 8 (eight) hours as needed for muscle spasms. (Patient not taking: Reported on 12/15/2017) 12 tablet 0  . predniSONE (DELTASONE) 20 MG tablet 60 mg x3d, 40 mg x3d, 20 mg x2d, 10 mg x2d (Patient not taking: Reported on 12/15/2017) 18 tablet 0  . VYVANSE 30 MG capsule      No facility-administered medications prior to visit.     No Known Allergies  ROS Review of Systems  Constitutional: Negative for appetite change, chills, fatigue and fever.  HENT: Negative for congestion, dental problem, ear pain and sore throat.   Eyes: Negative for discharge, redness and visual disturbance.  Respiratory: Negative for cough, chest tightness, shortness of breath and wheezing.   Cardiovascular: Negative for chest pain, palpitations and leg swelling.   Gastrointestinal: Negative for abdominal pain, blood in stool, diarrhea, nausea and vomiting.  Genitourinary: Negative for difficulty urinating, dysuria, flank pain, frequency, hematuria and urgency.  Musculoskeletal: Negative for arthralgias, back pain, joint swelling, myalgias and neck stiffness.  Skin: Negative for pallor and rash.  Neurological: Negative for dizziness, speech difficulty, weakness and headaches.  Hematological: Negative for adenopathy. Does not bruise/bleed easily.  Psychiatric/Behavioral: Negative for confusion and sleep disturbance. The patient is not nervous/anxious.     PE; Blood pressure 130/83, pulse 83, temperature 98.2 F (36.8 C), temperature source Oral, resp. rate 16, height 5' 7.5" (1.715 m), weight 272 lb 6 oz (123.5 kg), SpO2 93 %. Body mass index is 42.03 kg/m.  Gen: Alert, well appearing.  Patient is oriented to person, place, time, and situation. AFFECT: pleasant, lucid thought and speech. ENT: Ears: EACs clear, normal epithelium.  TMs with good light reflex and landmarks bilaterally.  Eyes: no injection, icteris, swelling, or exudate.  EOMI, PERRLA. Nose: no drainage or turbinate edema/swelling.  No injection or focal lesion.  Mouth: lips without lesion/swelling.  Oral mucosa pink and moist.  Dentition intact and without obvious caries or gingival swelling.  Oropharynx without erythema, exudate, or swelling.  Neck: supple/nontender.  No LAD, mass, or TM.  Carotid pulses 2+ bilaterally, without bruits. CV: RRR, no m/r/g.   LUNGS: CTA bilat, nonlabored resps, good aeration in all lung fields. ABD: soft, NT, ND, BS normal.  No hepatospenomegaly or mass.  No bruits. EXT: no clubbing, cyanosis, or edema.  Musculoskeletal: no joint swelling, erythema, warmth, or tenderness.  ROM of all joints intact. Skin - no sores or suspicious lesions or rashes or color changes   Pertinent labs:  Lab Results  Component Value Date   TSH 1.87 12/04/2016   Lab Results   Component Value Date   WBC 7.0 12/04/2016   HGB 14.6 12/04/2016   HCT 44.4 12/04/2016   MCV 87.4 12/04/2016   PLT 264.0 12/04/2016   Lab Results  Component Value Date   CREATININE 1.33 12/04/2016   BUN 18 12/04/2016   NA 143 12/04/2016   K 4.3 12/04/2016   CL 104 12/04/2016   CO2 32 12/04/2016   Lab Results  Component Value Date   ALT 22 12/04/2016   AST 15 12/04/2016   ALKPHOS 61 12/04/2016   BILITOT 0.3 12/04/2016   Lab Results  Component Value Date   CHOL 180 12/10/2016   Lab Results  Component Value Date   HDL 41.70 12/10/2016   Lab Results  Component Value Date   LDLCALC 127 (  H) 12/10/2016   Lab Results  Component Value Date   TRIG 60.0 12/10/2016   Lab Results  Component Value Date   CHOLHDL 4 12/10/2016   Lab Results  Component Value Date   HGBA1C 6.2 12/11/2016    ASSESSMENT AND PLAN:   Health maintenance exam: Reviewed age and gender appropriate health maintenance issues (prudent diet, regular exercise, health risks of tobacco and excessive alcohol, use of seatbelts, fire alarms in home, use of sunscreen).  Also reviewed age and gender appropriate health screening as well as vaccine recommendations. Vaccines:  Pt unsure of last Td.  Tdap--today. Labs: fasting HP + HbA1c (prediabetes). Prostate ca screening: average risk patient= start screening at age 47 yrs. Colon ca screening: as per latest screening guidelines, start screening at age 89-->  Referred to GI today.  Encouraged to stick with diet and exercise--he says he'll get in with nutritionist now.  An After Visit Summary was printed and given to the patient.  FOLLOW UP:  Return in about 1 year (around 12/16/2018) for annual CPE (fasting).  Signed:  Santiago BumpersPhil McGowen, MD           12/15/2017

## 2017-12-15 NOTE — Addendum Note (Signed)
Addended by: Regan RakersAY, Daquane Aguilar K on: 12/15/2017 11:30 AM   Modules accepted: Orders

## 2017-12-16 ENCOUNTER — Encounter: Payer: Self-pay | Admitting: *Deleted

## 2017-12-16 ENCOUNTER — Other Ambulatory Visit: Payer: Self-pay | Admitting: *Deleted

## 2017-12-16 DIAGNOSIS — R7303 Prediabetes: Secondary | ICD-10-CM

## 2018-01-11 ENCOUNTER — Ambulatory Visit: Payer: 59 | Admitting: Registered"

## 2018-01-17 ENCOUNTER — Encounter: Payer: Self-pay | Admitting: Family Medicine

## 2018-01-21 ENCOUNTER — Telehealth: Payer: Self-pay | Admitting: *Deleted

## 2018-01-21 NOTE — Telephone Encounter (Signed)
There was a new suggestion by one of the cancer societies to consider starting screening colonoscopy at age 47. However, not clear that all insurance carriers will proceed with this suggestion. With that being said, the patient should check with their insurance as to whether this will be covered as a screening before we go down this road. Thank you.

## 2018-01-21 NOTE — Telephone Encounter (Signed)
Dr. Meridee ScoreMansouraty,  This pt is coming in for a PV on 01-26-18 for a screening colonoscopy on 02-09-18.  He is 47 years old.  His family doctor made a note on 12-15-17 that he is due for a colonoscopy d/t being greater than 47 years of age.  I do not see a family hx of colon CA.  Is it time for him to have his procedure now?  Please advise,  Baxter HireKristen

## 2018-01-24 NOTE — Telephone Encounter (Signed)
Per Dr. Meridee ScoreMansouraty patient should check with their insurance to make sure they will pay for a screening colonoscopy at age 47. Patient is a direct screening with no family hx noted and patient does not have any symptoms. [facility]   Janalee DaneNancy Kelven Flater, LPN ( PV )

## 2018-01-26 ENCOUNTER — Encounter: Payer: Self-pay | Admitting: *Deleted

## 2018-02-03 ENCOUNTER — Telehealth: Payer: Self-pay | Admitting: *Deleted

## 2018-02-03 NOTE — Telephone Encounter (Signed)
Patient no show pv today. Called patient, no answer, left message for him to call us back today before 5 pm to reschedule the nurse visit.

## 2018-02-09 ENCOUNTER — Encounter: Payer: 59 | Admitting: Gastroenterology

## 2019-04-27 ENCOUNTER — Ambulatory Visit (INDEPENDENT_AMBULATORY_CARE_PROVIDER_SITE_OTHER): Payer: 59 | Admitting: Family Medicine

## 2019-04-27 ENCOUNTER — Encounter: Payer: Self-pay | Admitting: Family Medicine

## 2019-04-27 ENCOUNTER — Other Ambulatory Visit: Payer: Self-pay

## 2019-04-27 VITALS — BP 145/93 | HR 73 | Temp 98.4°F | Resp 16 | Ht 67.5 in | Wt 265.0 lb

## 2019-04-27 DIAGNOSIS — R7303 Prediabetes: Secondary | ICD-10-CM

## 2019-04-27 DIAGNOSIS — Z1211 Encounter for screening for malignant neoplasm of colon: Secondary | ICD-10-CM

## 2019-04-27 DIAGNOSIS — R030 Elevated blood-pressure reading, without diagnosis of hypertension: Secondary | ICD-10-CM

## 2019-04-27 DIAGNOSIS — Z0000 Encounter for general adult medical examination without abnormal findings: Secondary | ICD-10-CM

## 2019-04-27 NOTE — Patient Instructions (Addendum)
I encourage you to buy a blood pressure cuff (upper arm cuff, not wrist cuff) for home bp monitoring once a day for the next 2 wks and write down bp/hr numbers to review with me at next f/u visit.     DASH Eating Plan DASH stands for "Dietary Approaches to Stop Hypertension." The DASH eating plan is a healthy eating plan that has been shown to reduce high blood pressure (hypertension). It may also reduce your risk for type 2 diabetes, heart disease, and stroke. The DASH eating plan may also help with weight loss. What are tips for following this plan?  General guidelines  Avoid eating more than 2,300 mg (milligrams) of salt (sodium) a day. If you have hypertension, you may need to reduce your sodium intake to 1,500 mg a day.  Limit alcohol intake to no more than 1 drink a day for nonpregnant women and 2 drinks a day for men. One drink equals 12 oz of beer, 5 oz of wine, or 1 oz of hard liquor.  Work with your health care provider to maintain a healthy body weight or to lose weight. Ask what an ideal weight is for you.  Get at least 30 minutes of exercise that causes your heart to beat faster (aerobic exercise) most days of the week. Activities may include walking, swimming, or biking.  Work with your health care provider or diet and nutrition specialist (dietitian) to adjust your eating plan to your individual calorie needs. Reading food labels   Check food labels for the amount of sodium per serving. Choose foods with less than 5 percent of the Daily Value of sodium. Generally, foods with less than 300 mg of sodium per serving fit into this eating plan.  To find whole grains, look for the word "whole" as the first word in the ingredient list. Shopping  Buy products labeled as "low-sodium" or "no salt added."  Buy fresh foods. Avoid canned foods and premade or frozen meals. Cooking  Avoid adding salt when cooking. Use salt-free seasonings or herbs instead of table salt or sea salt.  Check with your health care provider or pharmacist before using salt substitutes.  Do not fry foods. Cook foods using healthy methods such as baking, boiling, grilling, and broiling instead.  Cook with heart-healthy oils, such as olive, canola, soybean, or sunflower oil. Meal planning  Eat a balanced diet that includes: ? 5 or more servings of fruits and vegetables each day. At each meal, try to fill half of your plate with fruits and vegetables. ? Up to 6-8 servings of whole grains each day. ? Less than 6 oz of lean meat, poultry, or fish each day. A 3-oz serving of meat is about the same size as a deck of cards. One egg equals 1 oz. ? 2 servings of low-fat dairy each day. ? A serving of nuts, seeds, or beans 5 times each week. ? Heart-healthy fats. Healthy fats called Omega-3 fatty acids are found in foods such as flaxseeds and coldwater fish, like sardines, salmon, and mackerel.  Limit how much you eat of the following: ? Canned or prepackaged foods. ? Food that is high in trans fat, such as fried foods. ? Food that is high in saturated fat, such as fatty meat. ? Sweets, desserts, sugary drinks, and other foods with added sugar. ? Full-fat dairy products.  Do not salt foods before eating.  Try to eat at least 2 vegetarian meals each week.  Eat more home-cooked food and  less restaurant, buffet, and fast food.  When eating at a restaurant, ask that your food be prepared with less salt or no salt, if possible. What foods are recommended? The items listed may not be a complete list. Talk with your dietitian about what dietary choices are best for you. Grains Whole-grain or whole-wheat bread. Whole-grain or whole-wheat pasta. Brown rice. Orpah Cobb. Bulgur. Whole-grain and low-sodium cereals. Pita bread. Low-fat, low-sodium crackers. Whole-wheat flour tortillas. Vegetables Fresh or frozen vegetables (raw, steamed, roasted, or grilled). Low-sodium or reduced-sodium tomato and  vegetable juice. Low-sodium or reduced-sodium tomato sauce and tomato paste. Low-sodium or reduced-sodium canned vegetables. Fruits All fresh, dried, or frozen fruit. Canned fruit in natural juice (without added sugar). Meat and other protein foods Skinless chicken or [place]. Ground chicken or [place]. Pork with fat trimmed off. Fish and seafood. Egg whites. Dried beans, peas, or lentils. Unsalted nuts, nut butters, and seeds. Unsalted canned beans. Lean cuts of beef with fat trimmed off. Low-sodium, lean deli meat. Dairy Low-fat (1%) or fat-free (skim) milk. Fat-free, low-fat, or reduced-fat cheeses. Nonfat, low-sodium ricotta or cottage cheese. Low-fat or nonfat yogurt. Low-fat, low-sodium cheese. Fats and oils Soft margarine without trans fats. Vegetable oil. Low-fat, reduced-fat, or light mayonnaise and salad dressings (reduced-sodium). Canola, safflower, olive, soybean, and sunflower oils. Avocado. Seasoning and other foods Herbs. Spices. Seasoning mixes without salt. Unsalted popcorn and pretzels. Fat-free sweets. What foods are not recommended? The items listed may not be a complete list. Talk with your dietitian about what dietary choices are best for you. Grains Baked goods made with fat, such as croissants, muffins, or some breads. Dry pasta or rice meal packs. Vegetables Creamed or fried vegetables. Vegetables in a cheese sauce. Regular canned vegetables (not low-sodium or reduced-sodium). Regular canned tomato sauce and paste (not low-sodium or reduced-sodium). Regular tomato and vegetable juice (not low-sodium or reduced-sodium). Rosita Fire. Olives. Fruits Canned fruit in a light or heavy syrup. Fried fruit. Fruit in cream or butter sauce. Meat and other protein foods Fatty cuts of meat. Ribs. Fried meat. Tomasa Blase. Sausage. Bologna and other processed lunch meats. Salami. Fatback. Hotdogs. Bratwurst. Salted nuts and seeds. Canned beans with added salt. Canned or smoked fish. Whole eggs or  egg yolks. Chicken or [place] with skin. Dairy Whole or 2% milk, cream, and half-and-half. Whole or full-fat cream cheese. Whole-fat or sweetened yogurt. Full-fat cheese. Nondairy creamers. Whipped toppings. Processed cheese and cheese spreads. Fats and oils Butter. Stick margarine. Lard. Shortening. Ghee. Bacon fat. Tropical oils, such as coconut, palm kernel, or palm oil. Seasoning and other foods Salted popcorn and pretzels. Onion salt, garlic salt, seasoned salt, table salt, and sea salt. Worcestershire sauce. Tartar sauce. Barbecue sauce. Teriyaki sauce. Soy sauce, including reduced-sodium. Steak sauce. Canned and packaged gravies. Fish sauce. Oyster sauce. Cocktail sauce. Horseradish that you find on the shelf. Ketchup. Mustard. Meat flavorings and tenderizers. Bouillon cubes. Hot sauce and Tabasco sauce. Premade or packaged marinades. Premade or packaged taco seasonings. Relishes. Regular salad dressings. Where to find more information:  National Heart, Lung, and Blood Institute: PopSteam.is  American Heart Association: www.heart.org Summary  The DASH eating plan is a healthy eating plan that has been shown to reduce high blood pressure (hypertension). It may also reduce your risk for type 2 diabetes, heart disease, and stroke.  With the DASH eating plan, you should limit salt (sodium) intake to 2,300 mg a day. If you have hypertension, you may need to reduce your sodium intake to 1,500 mg a day.  When on the DASH eating plan, aim to eat more fresh fruits and vegetables, whole grains, lean proteins, low-fat dairy, and heart-healthy fats.  Work with your health care provider or diet and nutrition specialist (dietitian) to adjust your eating plan to your individual calorie needs. This information is not intended to replace advice given to you by your health care provider. Make sure you discuss any questions you have with your health care provider. Document Released: 04/16/2011  Document Revised: 04/09/2017 Document Reviewed: 04/20/2016 Elsevier Patient Education  2020 [employer].

## 2019-04-27 NOTE — Progress Notes (Signed)
Office Note 04/27/2019  CC:  Chief Complaint  Patient presents with  . Annual Exam    pt is fasting    HPI:  Cameron Mccoy is a 48 y.o.  male who is here for annual health maintenance exam.  Activity: was walking a lot until recent colder fall weather. Shopping for [employer], unable to go to the GYM due covid. Diet: cut out simple sugars, cut out complex carbs some.  NO polyuria or polyphagia.   No home glucometer.  No home bp monitoring.   Rushed in for visit today.  Past Medical History:  Diagnosis Date  . Attention deficit disorder (ADD) in adult    was on vyvanse for 3 yrs, stopped approx 2016.  Dr. Elisabeth Most at [facility]ention [facility].  . Elevated blood pressure reading in office without diagnosis of hypertension   . Morbid obesity (HCC)   . Prediabetes 12/2016   Fasting gluc @120  x 2, A1c 6.2%.--nutritionist consult recommended.  A1c 6.5%-->pt [facility] nutritionist 12/2017.    History reviewed. No pertinent surgical history.  Family History  Problem Relation Age of Onset  . Diabetes Mother   . Diabetes Father   . Heart disease Maternal Grandmother   . Hypertension Maternal Grandmother     Social History   Socioeconomic History  . Marital status: Married    Spouse name: Not on file  . Number of children: Not on file  . Years of education: Not on file  . Highest education level: Not on file  Occupational History  . Not on file  Tobacco Use  . Smoking status: Never Smoker  . Smokeless tobacco: Never Used  Substance and Sexual Activity  . Alcohol use: No  . Drug use: No  . Sexual activity: Not on file  Other Topics Concern  . Not on file  Social History Narrative   Married, has 2 boys and 1 girl.   Educ: BS   Orig from [place], moved to [place] around age 20.   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   No T/A/Ds.   Social Determinants of Health   Financial Resource Strain:   . Difficulty of Paying Living Expenses: Not on  file  Food Insecurity:   . Worried About [employer] in the Last Year: Not on file  . Ran Out of Food in the Last Year: Not on file  Transportation Needs:   . Lack of Transportation (Medical): Not on file  . Lack of Transportation (Non-Medical): Not on file  Physical Activity:   . Days of Exercise per Week: Not on file  . Minutes of Exercise per Session: Not on file  Stress:   . Feeling of Stress : Not on file  Social Connections:   . Frequency of Communication with Friends and Family: Not on file  . Frequency of Social Gatherings with Friends and Family: Not on file  . Attends Religious Services: Not on file  . Active Member of Clubs or Organizations: Not on file  . Attends Programme researcher, broadcasting/film/video Meetings: Not on file  . Marital Status: Not on file  Intimate Partner Violence:   . Fear of Current or Ex-Partner: Not on file  . Emotionally Abused: Not on file  . Physically Abused: Not on file  . Sexually Abused: Not on file    MEDS: none  No Known Allergies  ROS Review of Systems  Constitutional: Negative for appetite change, chills, fatigue and fever.  HENT: Negative for congestion, dental problem, ear  pain and sore throat.   Eyes: Negative for discharge, redness and visual disturbance.  Respiratory: Negative for cough, chest tightness, shortness of breath and wheezing.   Cardiovascular: Negative for chest pain, palpitations and leg swelling.  Gastrointestinal: Negative for abdominal pain, blood in stool, diarrhea, nausea and vomiting.  Genitourinary: Negative for difficulty urinating, dysuria, flank pain, frequency, hematuria and urgency.  Musculoskeletal: Negative for arthralgias, back pain, joint swelling, myalgias and neck stiffness.  Skin: Negative for pallor and rash.  Neurological: Negative for dizziness, speech difficulty, weakness and headaches.  Hematological: Negative for adenopathy. Does not bruise/bleed easily.  Psychiatric/Behavioral: Negative for  confusion and sleep disturbance. The patient is not nervous/anxious.     PE;  Initial bp w/automated cuff today was 145/93.  Repeat manual cuff at end of visit was 138/84. Blood pressure (!) 145/93, pulse 73, temperature 98.4 F (36.9 C), temperature source Temporal, resp. rate 16, height 5' 7.5" (1.715 m), weight 265 lb (120.2 kg), SpO2 95 %. Body mass index is 40.89 kg/m.  Gen: Alert, well appearing.  Patient is oriented to person, place, time, and situation. AFFECT: pleasant, lucid thought and speech. ENT: Ears: EACs clear, normal epithelium.  TMs with good light reflex and landmarks bilaterally.  Eyes: no injection, icteris, swelling, or exudate.  EOMI, PERRLA. Nose: no drainage or turbinate edema/swelling.  No injection or focal lesion.  Mouth: lips without lesion/swelling.  Oral mucosa pink and moist.  Dentition intact and without obvious caries or gingival swelling.  Oropharynx without erythema, exudate, or swelling.  Neck: supple/nontender.  No LAD, mass, or TM.  Carotid pulses 2+ bilaterally, without bruits. CV: RRR, no m/r/g.   LUNGS: CTA bilat, nonlabored resps, good aeration in all lung fields. ABD: soft, NT, ND, BS normal.  No hepatospenomegaly or mass.  No bruits. EXT: no clubbing, cyanosis, or edema.  Musculoskeletal: no joint swelling, erythema, warmth, or tenderness.  ROM of all joints intact. Skin - no sores or suspicious lesions or rashes or color changes   Pertinent labs:  Lab Results  Component Value Date   TSH 1.70 12/15/2017   Lab Results  Component Value Date   WBC 5.1 12/15/2017   HGB 14.1 12/15/2017   HCT 42.6 12/15/2017   MCV 85.7 12/15/2017   PLT 268.0 12/15/2017   Lab Results  Component Value Date   CREATININE 1.04 12/15/2017   BUN 16 12/15/2017   NA 140 12/15/2017   K 3.8 12/15/2017   CL 103 12/15/2017   CO2 32 12/15/2017   Lab Results  Component Value Date   ALT 36 12/15/2017   AST 19 12/15/2017   ALKPHOS 55 12/15/2017   BILITOT 0.4  12/15/2017   Lab Results  Component Value Date   CHOL 175 12/15/2017   Lab Results  Component Value Date   HDL 43.20 12/15/2017   Lab Results  Component Value Date   LDLCALC 120 (H) 12/15/2017   Lab Results  Component Value Date   TRIG 60.0 12/15/2017   Lab Results  Component Value Date   CHOLHDL 4 12/15/2017   Lab Results  Component Value Date   HGBA1C 6.5 12/15/2017   ASSESSMENT AND PLAN:   1) Prediabetes and morbid obesity: he has made some mild TLCs. Wt is down about 7 lbs since I last saw him 16 mo ago. HbA1c today, fasting glucose today. CMET and FLP today. 2) Elev bp w/out dx of HTN: 142/88. Lytes/cr and TSH today.  Discussed/encouraged home bp monitoring and write down bp/hr numbers  to review with me at next f/u visit. DASH diet. Continue to increase efforts at diet/exercise/wt loss.  3) Health maintenance exam: Reviewed age and gender appropriate health maintenance issues (prudent diet, regular exercise, health risks of tobacco and excessive alcohol, use of seatbelts, fire alarms in home, use of sunscreen).  Also reviewed age and gender appropriate health screening as well as vaccine recommendations. Vaccines: flu vaccine recommended today->he declined at this time. Labs: fasting HP labs ordered--unable to get blood today b/c pt dehydrated.  We'll get these when he returns for recheck of his bps in 3 wks. Prostate ca screening: average risk patient= as per latest guidelines, start screening at 65 yrs of age. Colon ca screening: due for initial colon ca screening according to new guidelines->refer to GI today.  An After Visit Summary was printed and given to the patient.  FOLLOW UP:  Return in about 3 weeks (around 05/18/2019) for f/u bp telemedicine.  Signed:  Crissie Sickles, MD           04/27/2019

## 2019-05-12 DIAGNOSIS — E7800 Pure hypercholesterolemia, unspecified: Secondary | ICD-10-CM

## 2019-05-12 DIAGNOSIS — I10 Essential (primary) hypertension: Secondary | ICD-10-CM

## 2019-05-12 HISTORY — DX: Essential (primary) hypertension: I10

## 2019-05-12 HISTORY — DX: Pure hypercholesterolemia, unspecified: E78.00

## 2019-05-18 ENCOUNTER — Encounter: Payer: Self-pay | Admitting: Family Medicine

## 2019-05-18 ENCOUNTER — Other Ambulatory Visit: Payer: Self-pay

## 2019-05-18 ENCOUNTER — Ambulatory Visit (INDEPENDENT_AMBULATORY_CARE_PROVIDER_SITE_OTHER): Payer: 59 | Admitting: Family Medicine

## 2019-05-18 VITALS — BP 141/95 | HR 79 | Temp 98.2°F | Resp 16 | Ht 67.5 in | Wt 264.8 lb

## 2019-05-18 DIAGNOSIS — I10 Essential (primary) hypertension: Secondary | ICD-10-CM | POA: Diagnosis not present

## 2019-05-18 DIAGNOSIS — R7303 Prediabetes: Secondary | ICD-10-CM | POA: Diagnosis not present

## 2019-05-18 DIAGNOSIS — Z1211 Encounter for screening for malignant neoplasm of colon: Secondary | ICD-10-CM

## 2019-05-18 DIAGNOSIS — R030 Elevated blood-pressure reading, without diagnosis of hypertension: Secondary | ICD-10-CM

## 2019-05-18 LAB — COMPREHENSIVE METABOLIC PANEL
ALT: 25 U/L (ref 0–53)
AST: 16 U/L (ref 0–37)
Albumin: 4.3 g/dL (ref 3.5–5.2)
Alkaline Phosphatase: 63 U/L (ref 39–117)
BUN: 15 mg/dL (ref 6–23)
CO2: 32 mEq/L (ref 19–32)
Calcium: 9.1 mg/dL (ref 8.4–10.5)
Chloride: 104 mEq/L (ref 96–112)
Creatinine, Ser: 0.98 mg/dL (ref 0.40–1.50)
GFR: 98.53 mL/min (ref >60.00)
Glucose, Bld: 121 mg/dL — ABNORMAL HIGH (ref 70–99)
Potassium: 3.9 mEq/L (ref 3.5–5.1)
Sodium: 141 mEq/L (ref 135–145)
Total Bilirubin: 0.3 mg/dL (ref 0.2–1.2)
Total Protein: 6.7 g/dL (ref 6.0–8.3)

## 2019-05-18 LAB — HEMOGLOBIN A1C: Hgb A1c MFr Bld: 6.3 % (ref 4.6–6.5)

## 2019-05-18 LAB — CBC WITH DIFFERENTIAL/PLATELET
Basophils Absolute: 0 10*3/uL (ref 0.0–0.1)
Basophils Relative: 0.5 % (ref 0.0–3.0)
Eosinophils Absolute: 0.1 10*3/uL (ref 0.0–0.7)
Eosinophils Relative: 2.4 % (ref 0.0–5.0)
HCT: 42.8 % (ref 39.0–52.0)
Hemoglobin: 13.9 g/dL (ref 13.0–17.0)
Lymphocytes Relative: 34.6 % (ref 12.0–46.0)
Lymphs Abs: 1.8 10*3/uL (ref 0.7–4.0)
MCHC: 32.5 g/dL (ref 30.0–36.0)
MCV: 87.1 fl (ref 78.0–100.0)
Monocytes Absolute: 0.3 10*3/uL (ref 0.1–1.0)
Monocytes Relative: 6.5 % (ref 3.0–12.0)
Neutro Abs: 2.9 10*3/uL (ref 1.4–7.7)
Neutrophils Relative %: 56 % (ref 43.0–77.0)
Platelets: 240 10*3/uL (ref 150.0–400.0)
RBC: 4.91 Mil/uL (ref 4.22–5.81)
RDW: 14.5 % (ref 11.5–15.5)
WBC: 5.2 10*3/uL (ref 4.0–10.5)

## 2019-05-18 LAB — LIPID PANEL
Cholesterol: 199 mg/dL (ref 0–200)
HDL: 48.4 mg/dL (ref >39.00)
LDL Cholesterol: 138 mg/dL — ABNORMAL HIGH (ref 0–99)
NonHDL: 150.39
Total CHOL/HDL Ratio: 4
Triglycerides: 60 mg/dL (ref 0.0–149.0)
VLDL: 12 mg/dL (ref 0.0–40.0)

## 2019-05-18 LAB — TSH: TSH: 2.05 u[IU]/mL (ref 0.35–4.50)

## 2019-05-18 MED ORDER — METOPROLOL SUCCINATE ER 25 MG PO TB24: 25.0000 mg | ORAL_TABLET | Freq: Every day | ORAL | 0 refills | Status: DC

## 2019-05-18 NOTE — Addendum Note (Signed)
Addended by: Jeoffrey Massed on: 05/18/2019 11:11 AM   Modules accepted: Orders, Level of Service

## 2019-05-18 NOTE — Addendum Note (Signed)
Addended by: Jeoffrey Massed on: 05/18/2019 11:04 AM   Modules accepted: Orders

## 2019-05-18 NOTE — Progress Notes (Addendum)
OFFICE VISIT  05/18/2019   CC:  Chief Complaint  Patient presents with  . Follow-up    blood pressure     HPI:    Patient is a 49 y.o.  male who presents for 3 week f/u elevated bp w/out dx of HTN. Checked bp some but busy holidays and a cousin passed away and he had to travel to [place] for funeral. Sounds like the few he checked were 120s-130s/70s-80.   Still needs his standard HM labs that we tried to get 3 wks ago.  Feeling well, no acute complaints. He is fasting today.  Past Medical History:  Diagnosis Date  . Attention deficit disorder (ADD) in adult    was on vyvanse for 3 yrs, stopped approx 2016.  Dr. Johnnye Sima at [facility].  . Elevated blood pressure reading in office without diagnosis of hypertension   . Morbid obesity ([place])   . Prediabetes 12/2016   Fasting gluc @120  x 2, A1c 6.2%.--nutritionist consult recommended.  A1c 6.5%-->pt [facility] nutritionist 12/2017.    History reviewed. No pertinent surgical history.  No outpatient medications prior to visit.   No facility-administered medications prior to visit.    No Known Allergies  ROS As per HPI  PE: Blood pressure (!) 141/95, pulse 79, temperature 98.2 F (36.8 C), temperature source Temporal, resp. rate 16, height 5' 7.5" (1.715 m), weight 264 lb 12.8 oz (120.1 kg), SpO2 92 %. Body mass index is 40.86 kg/m.  Gen: Alert, well appearing.  Patient is oriented to person, place, time, and situation. AFFECT: pleasant, lucid thought and speech. CV: RRR, no m/r/g.  Rate 80 by me. LUNGS: CTA bilat, nonlabored resps, good aeration in all lung fields. EXT: no clubbing or cyanosis.  1+ bilat LL pitting edema.    LABS:  Lab Results  Component Value Date   TSH 1.70 12/15/2017   Lab Results  Component Value Date   WBC 5.1 12/15/2017   HGB 14.1 12/15/2017   HCT 42.6 12/15/2017   MCV 85.7 12/15/2017   PLT 268.0 12/15/2017   Lab Results  Component Value Date   CREATININE 1.04 12/15/2017   BUN 16 12/15/2017   NA 140 12/15/2017   K 3.8 12/15/2017   CL 103 12/15/2017   CO2 32 12/15/2017   Lab Results  Component Value Date   ALT 36 12/15/2017   AST 19 12/15/2017   ALKPHOS 55 12/15/2017   BILITOT 0.4 12/15/2017   Lab Results  Component Value Date   CHOL 175 12/15/2017   Lab Results  Component Value Date   HDL 43.20 12/15/2017   Lab Results  Component Value Date   LDLCALC 120 (H) 12/15/2017   Lab Results  Component Value Date   TRIG 60.0 12/15/2017   Lab Results  Component Value Date   CHOLHDL 4 12/15/2017   Lab Results  Component Value Date   HGBA1C 6.5 12/15/2017    IMPRESSION AND PLAN:  1) HTN, new dx-->Start toprol xl 25mg  qd.  Monitor bp and hr at least 3 d/week at home and we'll review these at f/u 1 mo. Therapeutic expectations and side effect profile of medication discussed today.  Patient's questions answered.  2) Colon ca screening: he requested referral to GI for colonoscopy so I ordered this today.  3) Recent CPE, with hx of prediabetes and morbid obesity--try again for Fasting HP labs today. Marland Kitchen An After Visit Summary was printed and given to the patient.  FOLLOW UP: Return in about 4 weeks (  around 06/15/2019) for f/u HTN.  Signed:  Santiago Bumpers, MD           05/18/2019

## 2019-05-19 ENCOUNTER — Encounter: Payer: Self-pay | Admitting: Family Medicine

## 2019-06-12 ENCOUNTER — Encounter: Payer: Self-pay | Admitting: Family Medicine

## 2019-06-19 ENCOUNTER — Ambulatory Visit: Payer: 59 | Admitting: Family Medicine

## 2019-06-19 NOTE — Progress Notes (Deleted)
OFFICE VISIT  06/19/2019   CC: No chief complaint on file.  HPI:    Patient is a 49 y.o.  male who presents for 1 mo f/u HTN. A/P as of last visit: "HTN, new dx-->Start toprol xl 25mg  qd.  Monitor bp and hr at least 3 d/week at home and we'll review these at f/u 1 mo."  Interim hx: ***  Past Medical History:  Diagnosis Date  . Attention deficit disorder (ADD) in adult    was on vyvanse for 3 yrs, stopped approx 2016.  Dr. 2017 at [facility] Attention [facility].  . Hypercholesterolemia 05/2019   Fram 10 yr= 10 %-->pt declined statin.  06/2019 Hypertension 05/2019  . Morbid obesity (HCC)   . Prediabetes 12/2016   Fasting gluc @120  x 2, A1c 6.2%.--nutritionist consult recommended.  A1c 6.5%-->pt [facility] nutritionist 12/2017.    No past surgical history on file.  Outpatient Medications Prior to Visit  Medication Sig Dispense Refill  . metoprolol succinate (TOPROL XL) 25 MG 24 hr tablet Take 1 tablet (25 mg total) by mouth daily. 30 tablet 0   No facility-administered medications prior to visit.    No Known Allergies  ROS As per HPI  PE: There were no vitals taken for this visit. ***  LABS:  Lab Results  Component Value Date   TSH 2.05 05/18/2019   Lab Results  Component Value Date   WBC 5.2 05/18/2019   HGB 13.9 05/18/2019   HCT 42.8 05/18/2019   MCV 87.1 05/18/2019   PLT 240.0 05/18/2019   Lab Results  Component Value Date   CREATININE 0.98 05/18/2019   BUN 15 05/18/2019   NA 141 05/18/2019   K 3.9 05/18/2019   CL 104 05/18/2019   CO2 32 05/18/2019   Lab Results  Component Value Date   ALT 25 05/18/2019   AST 16 05/18/2019   ALKPHOS 63 05/18/2019   BILITOT 0.3 05/18/2019   Lab Results  Component Value Date   CHOL 199 05/18/2019   Lab Results  Component Value Date   HDL 48.40 05/18/2019   Lab Results  Component Value Date   LDLCALC 138 (H) 05/18/2019   Lab Results  Component Value Date   TRIG 60.0 05/18/2019   Lab Results  Component Value  Date   CHOLHDL 4 05/18/2019   Lab Results  Component Value Date   HGBA1C 6.3 05/18/2019    IMPRESSION AND PLAN:  No problem-specific Assessment & Plan notes found for this encounter.   An After Visit Summary was printed and given to the patient.  FOLLOW UP: No follow-ups on file.  Signed:  07/16/2019, MD           06/19/2019

## 2019-08-12 ENCOUNTER — Ambulatory Visit: Payer: Self-pay | Attending: Internal Medicine

## 2019-08-12 DIAGNOSIS — Z23 Encounter for immunization: Secondary | ICD-10-CM

## 2019-08-12 NOTE — Progress Notes (Signed)
   Covid-19 Vaccination Clinic  Name:  Lerry Cordrey    MRN: [id number] DOB: 20-May-1970  08/12/2019  Mr. Spillane was observed post Covid-19 immunization for 15 minutes without incident. He was provided with Vaccine Information Sheet and instruction to access the V-Safe system.   Mr. Kitner was instructed to call 911 with any severe reactions post vaccine: Marland Kitchen Difficulty breathing  . Swelling of face and throat  . A fast heartbeat  . A bad rash all over body  . Dizziness and weakness   Immunizations Administered    Name Date Dose VIS Date Route   Pfizer COVID-19 Vaccine 08/12/2019 10:04 AM 0.3 mL 04/21/2019 Intramuscular   Manufacturer: [employer], [employer]   Lot: [id number]   NDC: [address]-3

## 2019-09-05 ENCOUNTER — Ambulatory Visit: Payer: Self-pay | Attending: Internal Medicine

## 2019-09-05 DIAGNOSIS — Z23 Encounter for immunization: Secondary | ICD-10-CM

## 2019-09-05 NOTE — Progress Notes (Signed)
   Covid-19 Vaccination Clinic  Name:  Cameron Mccoy    MRN: [id number] DOB: 08/01/1970  09/05/2019  [name] was observed post Covid-19 immunization for 15 minutes without incident. He was provided with Vaccine Information Sheet and instruction to access the V-Safe system.   [name] was instructed to call 911 with any severe reactions post vaccine: Marland Kitchen Difficulty breathing  . Swelling of face and throat  . A fast heartbeat  . A bad rash all over body  . Dizziness and weakness   Immunizations Administered    Name Date Dose VIS Date Route   Pfizer COVID-19 Vaccine 09/05/2019  9:45 AM 0.3 mL 07/05/2018 Intramuscular   Manufacturer: [employer], [employer]   Lot: [id number]   NDC: [address]-0

## 2020-05-13 ENCOUNTER — Other Ambulatory Visit: Payer: Self-pay

## 2020-05-14 ENCOUNTER — Encounter: Payer: 59 | Admitting: Family Medicine

## 2020-05-14 NOTE — Progress Notes (Deleted)
Office Note 05/14/2020  CC: No chief complaint on file.   HPI:  Cameron Mccoy is a 50 y.o. male who is here for annual health maintenance exam and f/u HTN, HLD, and prediabetes. A/P as of last visit ONE YEAR AGO: "1) HTN, new dx-->Start toprol xl 25mg  qd.  Monitor bp and hr at least 3 d/week at home and we'll review these at f/u 1 mo. Therapeutic expectations and side effect profile of medication discussed today.  Patient's questions answered.  2) Colon ca screening: he requested referral to GI for colonoscopy so I ordered this today.  3) Recent CPE, with hx of prediabetes and morbid obesity--try again for Fasting HP labs today."  INTERIM HX: ***  Past Medical History:  Diagnosis Date  . Attention deficit disorder (ADD) in adult    was on vyvanse for 3 yrs, stopped approx 2016.  Dr. 2017 at [facility]ention [facility].  . Hypercholesterolemia 05/2019   Fram 10 yr= 10 %-->pt declined statin.  06/2019 Hypertension 05/2019  . Morbid obesity (HCC)   . Prediabetes 12/2016   Fasting gluc @120  x 2, A1c 6.2%.--nutritionist consult recommended.  A1c 6.5%-->pt [facility] nutritionist 12/2017.    No past surgical history on file.  Family History  Problem Relation Age of Onset  . Diabetes Mother   . Diabetes Father   . Heart disease Maternal Grandmother   . Hypertension Maternal Grandmother     Social History   Socioeconomic History  . Marital status: Married    Spouse name: Not on file  . Number of children: Not on file  . Years of education: Not on file  . Highest education level: Not on file  Occupational History  . Not on file  Tobacco Use  . Smoking status: Never Smoker  . Smokeless tobacco: Never Used  Vaping Use  . Vaping Use: Never used  Substance and Sexual Activity  . Alcohol use: No  . Drug use: No  . Sexual activity: Not on file  Other Topics Concern  . Not on file  Social History Narrative   Married, has 2 boys and 1 girl.   Educ: BS   Orig from  [place], moved to [place] around age 58.   Occup: [place] [employer].   No T/A/Ds.   Social Determinants of Health   Financial Resource Strain: Not on file  Food Insecurity: Not on file  Transportation Needs: Not on file  Physical Activity: Not on file  Stress: Not on file  Social Connections: Not on file  Intimate Partner Violence: Not on file    Outpatient Medications Prior to Visit  Medication Sig Dispense Refill  . metoprolol succinate (TOPROL XL) 25 MG 24 hr tablet Take 1 tablet (25 mg total) by mouth daily. 30 tablet 0   No facility-administered medications prior to visit.    No Known Allergies  ROS *** PE; Vitals with BMI 05/18/2019 04/27/2019 12/15/2017  Height 5' 7.5" 5' 7.5" 5' 7.5"  Weight 264 lbs 13 oz 265 lbs 272 lbs 6 oz  BMI 40.84 40.87 42.01  Systolic 141 145 04/29/2019  Diastolic 95 93 83  Pulse 79 73 83     *** Pertinent labs:  Lab Results  Component Value Date   TSH 2.05 05/18/2019   Lab Results  Component Value Date   WBC 5.2 05/18/2019   HGB 13.9 05/18/2019   HCT 42.8 05/18/2019   MCV 87.1 05/18/2019   PLT 240.0 05/18/2019   Lab Results  Component Value Date   CREATININE 0.98 05/18/2019   BUN 15 05/18/2019   NA 141 05/18/2019   K 3.9 05/18/2019   CL 104 05/18/2019   CO2 32 05/18/2019   Lab Results  Component Value Date   ALT 25 05/18/2019   AST 16 05/18/2019   ALKPHOS 63 05/18/2019   BILITOT 0.3 05/18/2019   Lab Results  Component Value Date   CHOL 199 05/18/2019   Lab Results  Component Value Date   HDL 48.40 05/18/2019   Lab Results  Component Value Date   LDLCALC 138 (H) 05/18/2019   Lab Results  Component Value Date   TRIG 60.0 05/18/2019   Lab Results  Component Value Date   CHOLHDL 4 05/18/2019   Lab Results  Component Value Date   HGBA1C 6.3 05/18/2019   ASSESSMENT AND PLAN:   No problem-specific Assessment & Plan notes found for this encounter.   An After Visit Summary was  printed and given to the patient.  FOLLOW UP:  No follow-ups on file.  Signed:  Santiago Bumpers, MD           05/14/2020

## 2020-05-27 ENCOUNTER — Encounter: Payer: 59 | Admitting: Family Medicine

## 2020-06-03 ENCOUNTER — Encounter: Payer: Self-pay | Admitting: Family Medicine

## 2020-06-03 ENCOUNTER — Encounter: Payer: 59 | Admitting: Family Medicine

## 2020-06-03 DIAGNOSIS — Z0289 Encounter for other administrative examinations: Secondary | ICD-10-CM

## 2020-06-03 NOTE — Progress Notes (Deleted)
Office Note 06/03/2020  CC: No chief complaint on file.   HPI:  Cameron Mccoy is a 50 y.o. male who is here for annual health maintenance exam and f/u HTN, HLD, and prediabetes. A/P as of last visit ONE YEAR ago: "1) HTN, new dx-->Start toprol xl 25mg  qd.  Monitor bp and hr at least 3 d/week at home and we'll review these at f/u 1 mo. Therapeutic expectations and side effect profile of medication discussed today.  Patient's questions answered.  2) Colon ca screening: he requested referral to GI for colonoscopy so I ordered this today.  3) Recent CPE, with hx of prediabetes and morbid obesity--try again for Fasting HP labs today"  INTERIM HX: ***  HLD: lipids 1 yr ago mildly elevated, [place] 10 yr CV risk=9.6% at that time.  He declined trial of medication at that time.    Past Medical History:  Diagnosis Date  . Attention deficit disorder (ADD) in adult    was on vyvanse for 3 yrs, stopped approx 2016.  Dr. 2017 at [facility] Attention [facility].  . Hypercholesterolemia 05/2019   Fram 10 yr= 10 %-->pt declined statin.  06/2019 Hypertension 05/2019  . Morbid obesity (HCC)   . Prediabetes 12/2016   Fasting gluc @120  x 2, A1c 6.2%.--nutritionist consult recommended.  A1c 6.5%-->pt [facility] nutritionist 12/2017.    No past surgical history on file.  Family History  Problem Relation Age of Onset  . Diabetes Mother   . Diabetes Father   . Heart disease Maternal Grandmother   . Hypertension Maternal Grandmother     Social History   Socioeconomic History  . Marital status: Married    Spouse name: Not on file  . Number of children: Not on file  . Years of education: Not on file  . Highest education level: Not on file  Occupational History  . Not on file  Tobacco Use  . Smoking status: Never Smoker  . Smokeless tobacco: Never Used  Vaping Use  . Vaping Use: Never used  Substance and Sexual Activity  . Alcohol use: No  . Drug use: No  . Sexual activity: Not on  file  Other Topics Concern  . Not on file  Social History Narrative   Married, has 2 boys and 1 girl.   Educ: BS   Orig from [place], moved to [place] around age 49.   Occup: [place] [employer].   No T/A/Ds.   Social Determinants of Health   Financial Resource Strain: Not on file  Food Insecurity: Not on file  Transportation Needs: Not on file  Physical Activity: Not on file  Stress: Not on file  Social Connections: Not on file  Intimate Partner Violence: Not on file    Outpatient Medications Prior to Visit  Medication Sig Dispense Refill  . metoprolol succinate (TOPROL XL) 25 MG 24 hr tablet Take 1 tablet (25 mg total) by mouth daily. 30 tablet 0   No facility-administered medications prior to visit.    No Known Allergies  ROS *** PE; Vitals with BMI 05/18/2019 04/27/2019 12/15/2017  Height 5' 7.5" 5' 7.5" 5' 7.5"  Weight 264 lbs 13 oz 265 lbs 272 lbs 6 oz  BMI 40.84 40.87 42.01  Systolic 141 145 04/29/2019  Diastolic 95 93 83  Pulse 79 73 83     *** Pertinent labs:  Lab Results  Component Value Date   TSH 2.05 05/18/2019   Lab Results  Component Value Date   WBC  5.2 05/18/2019   HGB 13.9 05/18/2019   HCT 42.8 05/18/2019   MCV 87.1 05/18/2019   PLT 240.0 05/18/2019   Lab Results  Component Value Date   CREATININE 0.98 05/18/2019   BUN 15 05/18/2019   NA 141 05/18/2019   K 3.9 05/18/2019   CL 104 05/18/2019   CO2 32 05/18/2019   Lab Results  Component Value Date   ALT 25 05/18/2019   AST 16 05/18/2019   ALKPHOS 63 05/18/2019   BILITOT 0.3 05/18/2019   Lab Results  Component Value Date   CHOL 199 05/18/2019   Lab Results  Component Value Date   HDL 48.40 05/18/2019   Lab Results  Component Value Date   LDLCALC 138 (H) 05/18/2019   Lab Results  Component Value Date   TRIG 60.0 05/18/2019   Lab Results  Component Value Date   CHOLHDL 4 05/18/2019   Lab Results  Component Value Date   HGBA1C 6.3 05/18/2019    ASSESSMENT AND PLAN:   No problem-specific Assessment & Plan notes found for this encounter.     Health maintenance exam: Reviewed age and gender appropriate health maintenance issues (prudent diet, regular exercise, health risks of tobacco and excessive alcohol, use of seatbelts, fire alarms in home, use of sunscreen).  Also reviewed age and gender appropriate health screening as well as vaccine recommendations. Vaccines: Flu->***.  Covid and Tdap UTD.  Will discuss shingrix at CPE in 1 yr. Labs:  HP labs +Hba1c ordered. Cervical ca screening: Colon ca screening: I referred him to GI for screening at last cpe->***.  An After Visit Summary was printed and given to the patient.  FOLLOW UP:  No follow-ups on file.  Signed:  Santiago Bumpers, MD           06/03/2020

## 2023-09-20 ENCOUNTER — Ambulatory Visit (INDEPENDENT_AMBULATORY_CARE_PROVIDER_SITE_OTHER): Admitting: Urgent Care

## 2023-09-20 ENCOUNTER — Encounter: Payer: Self-pay | Admitting: Urgent Care

## 2023-09-20 ENCOUNTER — Other Ambulatory Visit: Payer: Self-pay | Admitting: Urgent Care

## 2023-09-20 VITALS — BP 168/118 | HR 78 | Ht 67.0 in | Wt 281.0 lb

## 2023-09-20 DIAGNOSIS — Z125 Encounter for screening for malignant neoplasm of prostate: Secondary | ICD-10-CM

## 2023-09-20 DIAGNOSIS — Z1159 Encounter for screening for other viral diseases: Secondary | ICD-10-CM

## 2023-09-20 DIAGNOSIS — R7303 Prediabetes: Secondary | ICD-10-CM | POA: Diagnosis not present

## 2023-09-20 DIAGNOSIS — E1169 Type 2 diabetes mellitus with other specified complication: Secondary | ICD-10-CM

## 2023-09-20 DIAGNOSIS — Z114 Encounter for screening for human immunodeficiency virus [HIV]: Secondary | ICD-10-CM

## 2023-09-20 DIAGNOSIS — I10 Essential (primary) hypertension: Secondary | ICD-10-CM

## 2023-09-20 DIAGNOSIS — R0683 Snoring: Secondary | ICD-10-CM

## 2023-09-20 DIAGNOSIS — Z6841 Body Mass Index (BMI) 40.0 and over, adult: Secondary | ICD-10-CM

## 2023-09-20 DIAGNOSIS — Z0000 Encounter for general adult medical examination without abnormal findings: Secondary | ICD-10-CM | POA: Diagnosis not present

## 2023-09-20 DIAGNOSIS — Z1211 Encounter for screening for malignant neoplasm of colon: Secondary | ICD-10-CM

## 2023-09-20 LAB — CBC
HCT: 44.8 % (ref 39.0–52.0)
Hemoglobin: 14.8 g/dL (ref 13.0–17.0)
MCHC: 33 g/dL (ref 30.0–36.0)
MCV: 86.5 fl (ref 78.0–100.0)
Platelets: 244 10*3/uL (ref 150.0–400.0)
RBC: 5.18 Mil/uL (ref 4.22–5.81)
RDW: 14 % (ref 11.5–15.5)
WBC: 5 10*3/uL (ref 4.0–10.5)

## 2023-09-20 LAB — COMPREHENSIVE METABOLIC PANEL WITH GFR
ALT: 32 U/L (ref 0–53)
AST: 17 U/L (ref 0–37)
Albumin: 4.3 g/dL (ref 3.5–5.2)
Alkaline Phosphatase: 76 U/L (ref 39–117)
BUN: 13 mg/dL (ref 6–23)
CO2: 33 meq/L — ABNORMAL HIGH (ref 19–32)
Calcium: 9.2 mg/dL (ref 8.4–10.5)
Chloride: 101 meq/L (ref 96–112)
Creatinine, Ser: 0.85 mg/dL (ref 0.40–1.50)
GFR: 99.61 mL/min (ref >60.00)
Glucose, Bld: 133 mg/dL — ABNORMAL HIGH (ref 70–99)
Potassium: 3.8 meq/L (ref 3.5–5.1)
Sodium: 142 meq/L (ref 135–145)
Total Bilirubin: 0.4 mg/dL (ref 0.2–1.2)
Total Protein: 7 g/dL (ref 6.0–8.3)

## 2023-09-20 LAB — LIPID PANEL
Cholesterol: 207 mg/dL — ABNORMAL HIGH (ref 0–200)
HDL: 48.3 mg/dL
LDL Cholesterol: 146 mg/dL — ABNORMAL HIGH (ref 0–99)
NonHDL: 158.51
Total CHOL/HDL Ratio: 4
Triglycerides: 64 mg/dL (ref 0.0–149.0)
VLDL: 12.8 mg/dL (ref 0.0–40.0)

## 2023-09-20 LAB — TSH: TSH: 2.55 u[IU]/mL (ref 0.35–5.50)

## 2023-09-20 LAB — PSA: PSA: 0.67 ng/mL (ref 0.10–4.00)

## 2023-09-20 LAB — HEMOGLOBIN A1C: Hgb A1c MFr Bld: 7 % — ABNORMAL HIGH (ref 4.6–6.5)

## 2023-09-20 MED ORDER — ATORVASTATIN CALCIUM 10 MG PO TABS: 10.0000 mg | ORAL_TABLET | Freq: Every evening | ORAL | 1 refills | Status: DC

## 2023-09-20 MED ORDER — TIRZEPATIDE 2.5 MG/0.5ML ~~LOC~~ SOAJ: 2.5000 mg | SUBCUTANEOUS | 0 refills | Status: DC

## 2023-09-20 MED ORDER — TELMISARTAN-HCTZ 40-12.5 MG PO TABS: 1.0000 | ORAL_TABLET | Freq: Every day | ORAL | 0 refills | Status: DC

## 2023-09-20 NOTE — Patient Instructions (Addendum)
 We completed your wellness visit. Please return in 2-3 weeks for recheck  Please try to incorporate small consistent meals high in protein and fiber throughout the day.  Please start taking telmisartan-hydrochlorothiazide for your blood pressure. Take one daily in the morning.  We have ordered a home sleep study from [facility]. They should contact you, however if you have not heard anything within 5 business days, you can contact them directly at [phone].   Schedule a colonoscopy with gastroenterology.

## 2023-09-20 NOTE — Progress Notes (Signed)
 Annual Wellness Visit     Patient: Cameron Mccoy, Male    DOB: Oct 13, 1970, 53 y.o.   MRN: [id number]  Subjective  Chief Complaint  Patient presents with   Annual Exam    New pt. Pt would like to have a CPE. Pt is fasting today.    Cameron Mccoy is a 53 y.o. male who presents today for his Annual Wellness Visit. He reports consuming a general diet. The patient does not participate in regular exercise at present. He generally feels poorly due to not being on medications that previously helped, such as Vyvanse.  He reports sleeping fairly well but snores a lot. He does have additional problems to discuss today.   HPI   Discussed the use of AI scribe software for clinical note transcription with the patient, who gave verbal consent to proceed.  History of Present Illness   Cameron Mccoy is a 53 year old male who presents for an annual physical exam and to discuss weight loss and preventative care.  He has not had a colonoscopy and wishes to address this as part of his health maintenance. His weight has been stable, fluctuating between 265 and 280 pounds, but he aims to reduce it to below 200 pounds, with a final goal of 175 pounds. He has previously lost weight by walking 4.5 to 5 miles daily, which brought his weight down to 238 pounds, but he has since regained it.  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 He engages in gardening on Saturdays for physical activity but desires a more consistent exercise regimen. He acknowledges a sedentary lifestyle and aims to incorporate more physical activity into his daily routine.  He reports snoring at night, which his wife has noted, but does not feel fatigued during the day. He believes weight loss might improve his sleep quality. He does not currently monitor his blood pressure but recalls being prescribed  metoprolol  in 2021, which he did not take due to concerns about side effects. No headaches, blurred vision, or chest pressure.  His past medical history includes a diagnosis of prediabetes, with an A1c that was once in the diabetic range five years ago but has since improved slightly. He is not currently on any diabetes medication. He has a history of ADHD and was previously on Vyvanse but discontinued it due to side effects.  He does not follow up regularly with an eye doctor or dentist, with his last eye exam occurring three to four years ago and his last dental visit a couple of years ago. He acknowledges the need to improve his health maintenance practices.      Vision:Not within last year  and Dental: No regular dental care    Patient Active Problem List   Diagnosis Date Noted   Elevated blood-pressure reading without diagnosis of hypertension 06/11/2017   Acute pain of left shoulder 06/11/2017   Past Medical History:  Diagnosis Date   Attention deficit disorder (ADD) in adult    was on vyvanse for 3 yrs, stopped approx 2016.  Dr. Adell Age at [facility]ention [facility].   Hypercholesterolemia 05/2019   Fram 10 yr= 10 %-->pt declined statin.   Hypertension 05/2019   Morbid obesity (HCC)    Prediabetes 12/2016   Fasting gluc @120  x 2, A1c 6.2%.--nutritionist consult recommended.  A1c 6.5%-->pt [facility] nutritionist 12/2017.   History reviewed. No pertinent surgical history. Social History  Tobacco Use   Smoking status: Never   Smokeless tobacco: Never  Vaping Use   Vaping status: Never Used  Substance Use Topics   Alcohol use: No   Drug use: No      Medications: Outpatient Medications Prior to Visit  Medication Sig   [DISCONTINUED] metoprolol  succinate (TOPROL  XL) 25 MG 24 hr tablet Take 1 tablet (25 mg total) by mouth daily. (Patient not taking: Reported on 09/20/2023)   No facility-administered medications prior to visit.    No Known Allergies  Patient Care  Team: Mandy Second, [place] as PCP - General (Physician Assistant) Johnette Naegeli, Minetta Aly, MD (Family Medicine)  ROS Complete 12 point ROS performed with all pertinent positives listed in HPI     Objective  BP (!) 168/118   Pulse 78   Ht 5\' 7"  (1.702 m)   Wt 281 lb (127.5 kg)   SpO2 96%   BMI 44.01 kg/m  BP Readings from Last 3 Encounters:  09/20/23 (!) 168/118  05/18/19 (!) 141/95  04/27/19 (!) 145/93   Wt Readings from Last 3 Encounters:  09/20/23 281 lb (127.5 kg)  05/18/19 264 lb 12.8 oz (120.1 kg)  04/27/19 265 lb (120.2 kg)      Physical Exam Vitals and nursing note reviewed.  Constitutional:      General: He is not in acute distress.    Appearance: Normal appearance. He is obese. He is not ill-appearing, toxic-appearing or diaphoretic.  HENT:     Head: Normocephalic and atraumatic.     Right Ear: Tympanic membrane, ear canal and external ear normal.     Left Ear: Tympanic membrane, ear canal and external ear normal.     Nose: Nose normal.     Mouth/Throat:     Mouth: Mucous membranes are moist.     Pharynx: Oropharynx is clear. No oropharyngeal exudate or posterior oropharyngeal erythema.  Eyes:     General: Lids are normal. No scleral icterus.       Right eye: No discharge.        Left eye: No discharge.     Extraocular Movements: Extraocular movements intact.     Pupils: Pupils are equal, round, and reactive to light.   Neck:     Thyroid : No thyroid  mass, thyromegaly or thyroid  tenderness.  Cardiovascular:     Rate and Rhythm: Normal rate and regular rhythm.     Pulses: Normal pulses.     Heart sounds: No murmur heard. Pulmonary:     Effort: Pulmonary effort is normal. No respiratory distress.     Breath sounds: Normal breath sounds. No stridor. No wheezing or rhonchi.  Musculoskeletal:     Cervical back: Normal range of motion and neck supple. No rigidity or tenderness.     Right lower leg: No edema.     Left lower leg: No edema.  Lymphadenopathy:      Cervical: No cervical adenopathy.  Skin:    General: Skin is warm and dry.     Coloration: Skin is not jaundiced.     Findings: No bruising, erythema or rash.  Neurological:     General: No focal deficit present.     Mental Status: He is alert and oriented to person, place, and time.     Sensory: No sensory deficit.     Motor: No weakness.  Psychiatric:        Mood and Affect: Mood normal.        Behavior: Behavior normal.  Most recent functional status assessment:     No data to display         Most recent fall risk assessment:    09/20/2023    1:13 PM  Fall Risk   Falls in the past year? 0  Number falls in past yr: 0  Injury with Fall? 0  Risk for fall due to : No Fall Risks  Follow up Falls evaluation completed    Most recent depression screenings:    09/20/2023    1:13 PM 04/27/2019    4:12 PM  PHQ 2/9 Scores  PHQ - 2 Score 0 0  PHQ- 9 Score 6    Most recent cognitive screening:     No data to display         Most recent Audit-C alcohol use screening     No data to display         A score of 3 or more in women, and 4 or more in men indicates increased risk for alcohol abuse, EXCEPT if all of the points are from question 1   Vision/Hearing Screen: No results found.   Assessment & Plan   Annual wellness visit done today including the all of the following: Reviewed patient's Family Medical History Reviewed and updated list of patient's medical providers Assessment of cognitive impairment was done Assessed patient's functional ability Established a written schedule for health screening services Health Risk Assessent Completed and Reviewed  Exercise Activities and Dietary recommendations  Goals   200 # in the next 10 months with a final goal weight of 175#     Immunization History  Administered Date(s) Administered   PFIZER(Purple Top)SARS-COV-2 Vaccination 08/12/2019, 09/05/2019, 05/01/2020   Tdap 12/15/2017    Health  Maintenance  Topic Date Due   HIV Screening  Never done   Hepatitis C Screening  Never done   Colonoscopy  Never done   Zoster Vaccines- Shingrix (1 of 2) Never done   COVID-19 Vaccine (4 - 2024-25 season) 09/29/2024 (Originally 01/10/2023)   INFLUENZA VACCINE  12/10/2023   DTaP/Tdap/Td (2 - Td or Tdap) 12/16/2027   HPV VACCINES  Aged Out   Meningococcal B Vaccine  Aged Out     Discussed health benefits of physical activity, and encouraged him to engage in regular exercise appropriate for his age and condition.    Problem List Items Addressed This Visit   None Visit Diagnoses       Wellness examination    -  Primary   Relevant Orders   CBC (Completed)   Comprehensive metabolic panel with GFR (Completed)   Hemoglobin A1c (Completed)   Lipid panel (Completed)   PSA (Completed)   TSH (Completed)     Encounter for screening for HIV       Relevant Orders   HIV Antibody (routine testing w rflx)     Need for hepatitis C screening test       Relevant Orders   Hepatitis C Antibody     Prediabetes         Screen for colon cancer       Relevant Orders   Ambulatory referral to Gastroenterology     BMI 40.0-44.9, adult (HCC)         Snoring         Hypertension, unspecified type       Relevant Medications   telmisartan-hydrochlorothiazide (MICARDIS HCT) 40-12.5 MG tablet      Assessment and Plan    Hypertension Blood pressure  elevated. Discussed importance of management to prevent complications. Addressed concerns about medication side effects. -start telmisartan-hydrochlorothiazide. Goal <130/80.  Prediabetes Previous A1c in prediabetic range. Discussed potential use of non-insulin injectables for glucose control and weight loss if labs indicate diabetes. - Order A1c and glucose levels. - Discuss potential use of GLP-1 for glucose control if indicated.  Obesity Motivated to lose weight. Discussed exercise, dietary changes, and efficient workout options. - Encourage  consistent exercise routine, at least 15 minutes daily. - Discuss dietary changes including smaller, more frequent meals and healthier food choices. - Consider use of [employer] Max Trainer or similar equipment for efficient workouts.  Sleep apnea (suspected) Reports snoring, potential sleep apnea. Discussed health impacts of untreated sleep apnea. - Order home sleep apnea test using a ring device for two consecutive nights.  Wellness Visit Annual visit for health assessment and preventive care. Discussed weight loss strategies and importance of regular health maintenance. - Schedule colonoscopy with gastroenterology. - Order routine fasting labs including HIV and hepatitis C screening. - Encourage annual eye exams. - Encourage regular dental visits. - Encourage him to sign up for MyChart for communication.  Follow-up Discussed importance of accountability and sustainable lifestyle changes. - Follow up in 2-3 weeks to review lab results and discuss progress with lifestyle changes.        Return in about 2 weeks (around 10/04/2023).     Mandy Second, PA

## 2023-09-21 ENCOUNTER — Ambulatory Visit: Payer: Self-pay

## 2023-09-21 ENCOUNTER — Telehealth: Payer: Self-pay

## 2023-09-21 LAB — HIV ANTIBODY (ROUTINE TESTING W REFLEX): HIV 1&2 Ab, 4th Generation: NONREACTIVE

## 2023-09-21 LAB — HEPATITIS C ANTIBODY: Hepatitis C Ab: NONREACTIVE

## 2023-09-21 NOTE — Telephone Encounter (Signed)
 Pharmacy Patient Advocate Encounter   Received notification from CoverMyMeds that prior authorization for Mounjaro 2.5MG /0.5ML auto-injectors  is required/requested.   Insurance verification completed.   The patient is insured through [facility] .   Per test claim: PA required; PA submitted to above mentioned insurance via CoverMyMeds Key/confirmation #/EOC [id number] Status is pending

## 2023-09-22 ENCOUNTER — Other Ambulatory Visit (HOSPITAL_COMMUNITY): Payer: Self-pay

## 2023-09-22 NOTE — Telephone Encounter (Signed)
 Pharmacy Patient Advocate Encounter  Received notification from [facility] [employer] that Prior Authorization for Mounjaro 2.5MG /0.5ML auto-injectors has been APPROVED from 09/21/2023 to 09/20/2024 SEE APPROVAL MESSAGE FROM PLAN BELOW   PA #/Case ID/Reference #: [id number]  Message from Plan PA Case: [id number], Status: Approved, Coverage Starts on: 09/21/2023 12:00:00 AM, Coverage Ends on: 09/20/2024 12:00:00 AM.. Authorization

## 2023-10-05 ENCOUNTER — Ambulatory Visit: Admitting: Urgent Care

## 2023-10-20 ENCOUNTER — Ambulatory Visit (INDEPENDENT_AMBULATORY_CARE_PROVIDER_SITE_OTHER): Admitting: Urgent Care

## 2023-10-20 ENCOUNTER — Encounter: Payer: Self-pay | Admitting: Urgent Care

## 2023-10-20 VITALS — BP 146/86 | HR 91 | Temp 98.0°F | Wt 280.8 lb

## 2023-10-20 DIAGNOSIS — I10 Essential (primary) hypertension: Secondary | ICD-10-CM | POA: Diagnosis not present

## 2023-10-20 DIAGNOSIS — E1165 Type 2 diabetes mellitus with hyperglycemia: Secondary | ICD-10-CM | POA: Diagnosis not present

## 2023-10-20 DIAGNOSIS — E785 Hyperlipidemia, unspecified: Secondary | ICD-10-CM

## 2023-10-20 DIAGNOSIS — E1169 Type 2 diabetes mellitus with other specified complication: Secondary | ICD-10-CM | POA: Diagnosis not present

## 2023-10-20 DIAGNOSIS — Z7985 Long-term (current) use of injectable non-insulin antidiabetic drugs: Secondary | ICD-10-CM

## 2023-10-20 MED ORDER — MOUNJARO 5 MG/0.5ML ~~LOC~~ SOAJ: 5.0000 mg | SUBCUTANEOUS | 0 refills | Status: DC

## 2023-10-20 MED ORDER — TELMISARTAN-HCTZ 80-12.5 MG PO TABS: 1.0000 | ORAL_TABLET | Freq: Every day | ORAL | 1 refills | Status: DC

## 2023-10-20 NOTE — Patient Instructions (Addendum)
 Goal BP 120/80. Please purchase a blood pressure cuff that has a memory, monitor this intermittently 3-4x/ week. Check at least one hour after taking your blood pressure medication in the morning.  After your 4th injection of 2.5mg  mounjaro, please increase to 5mg  weekly. Notify me in one month if tolerating and we will increase to 7.5mg  at that time.  [facility] at [facility] [address] [place], [place] [address] Phone: [phone]   Please call the above phone number to schedule your follow up visit. 2 months.

## 2023-10-20 NOTE — Progress Notes (Signed)
 Established Patient Office Visit  Subjective:  Patient ID: Cameron Mccoy, male    DOB: 07-17-1970  Age: 53 y.o. MRN: [id number]  No chief complaint on file.   HPI  Discussed the use of AI scribe software for clinical note transcription with the patient, who gave verbal consent to proceed.  History of Present Illness   Cameron Mccoy is a 53 year old male with hypertension and hyperlipidemia who presents for follow-up on medication management and lifestyle changes.  He is currently taking telmisartan  HCTZ in the morning and atorvastatin  at night for about a month. He does not monitor his blood pressure at home. His in-office blood pressure reading was 140/90 mmHg.  He has been prescribed tirzepatide , with three weekly injections completed so far, and reports no side effects such as nausea or abdominal cramping. Despite this, he has not noticed any weight loss. He is actively engaging in physical activity, including walking up to three and a half miles, gardening, and plans to increase his walking distance to five miles.  His cholesterol was previously recorded at 207 mg/dL, with LDL at 914 mg/dL. He is on atorvastatin  to manage his cholesterol levels, and this is the first time he has diligently taken any medication for cholesterol.  He has not been previously diagnosed with diabetes and has never taken any diabetic medication before. He is currently using Mounjaro .  He eats once a day and is concerned about his weight, despite his efforts to increase physical activity. He experiences leg pain and shin discomfort during his walks.       Patient Active Problem List   Diagnosis Date Noted   Elevated blood-pressure reading without diagnosis of hypertension 06/11/2017   Acute pain of left shoulder 06/11/2017   Past Medical History:  Diagnosis Date   Attention deficit disorder (ADD) in adult    was on vyvanse for 3 yrs, stopped approx 2016.  [facility]. Adell [facility] at [facility]ention  [facility].   Hypercholesterolemia 05/2019   Fram 10 yr= 10 %-->pt declined statin.   Hypertension 05/2019   Morbid obesity (HCC)    Prediabetes 12/2016   Fasting gluc @120  x 2, A1c 6.2%.--nutritionist consult recommended.  A1c 6.5%-->pt [facility] nutritionist 12/2017.   History reviewed. No pertinent surgical history. Social History   Tobacco Use   Smoking status: Never   Smokeless tobacco: Never  Vaping Use   Vaping status: Never Used  Substance Use Topics   Alcohol use: No   Drug use: No      ROS: as noted in HPI  Objective:     BP (!) 146/86   Pulse 91   Temp 98 F (36.7 C)   Wt 280 lb 12.8 oz (127.4 kg)   SpO2 94%   BMI 43.98 kg/m  BP Readings from Last 3 Encounters:  10/20/23 (!) 146/86  09/20/23 (!) 168/118  05/18/19 (!) 141/95   Wt Readings from Last 3 Encounters:  10/20/23 280 lb 12.8 oz (127.4 kg)  09/20/23 281 lb (127.5 kg)  05/18/19 264 lb 12.8 oz (120.1 kg)      Physical Exam Vitals and nursing note reviewed.  Constitutional:      General: He is not in acute distress.    Appearance: Normal appearance. He is obese. He is not ill-appearing, toxic-appearing or diaphoretic.  HENT:     Head: Normocephalic and atraumatic.     Right Ear: External ear normal.     Left Ear: External ear normal.     Nose: Nose normal.  Mouth/Throat:     Mouth: Mucous membranes are moist.   Eyes:     General: No scleral icterus.       Right eye: No discharge.        Left eye: No discharge.     Pupils: Pupils are equal, round, and reactive to light.    Cardiovascular:     Rate and Rhythm: Normal rate and regular rhythm.  Pulmonary:     Effort: Pulmonary effort is normal. No respiratory distress.     Breath sounds: Normal breath sounds. No stridor.   Skin:    General: Skin is warm and dry.     Findings: No erythema or rash.   Neurological:     General: No focal deficit present.     Mental Status: He is alert and oriented to person, place, and time.       No results found for any visits on 10/20/23.  Last CBC Lab Results  Component Value Date   WBC 5.0 09/20/2023   HGB 14.8 09/20/2023   HCT 44.8 09/20/2023   MCV 86.5 09/20/2023   RDW 14.0 09/20/2023   PLT 244.0 09/20/2023   Last metabolic panel Lab Results  Component Value Date   GLUCOSE 133 (H) 09/20/2023   NA 142 09/20/2023   K 3.8 09/20/2023   CL 101 09/20/2023   CO2 33 (H) 09/20/2023   BUN 13 09/20/2023   CREATININE 0.85 09/20/2023   GFR 99.61 09/20/2023   CALCIUM  9.2 09/20/2023   PROT 7.0 09/20/2023   ALBUMIN 4.3 09/20/2023   BILITOT 0.4 09/20/2023   ALKPHOS 76 09/20/2023   AST 17 09/20/2023   ALT 32 09/20/2023   Last lipids Lab Results  Component Value Date   CHOL 207 (H) 09/20/2023   HDL 48.30 09/20/2023   LDLCALC 146 (H) 09/20/2023   TRIG 64.0 09/20/2023   CHOLHDL 4 09/20/2023   Last hemoglobin A1c Lab Results  Component Value Date   HGBA1C 7.0 (H) 09/20/2023   Last thyroid  functions Lab Results  Component Value Date   TSH 2.55 09/20/2023   Last vitamin D No results found for: 25OHVITD2, 25OHVITD3, VD25OH Last vitamin B12 and Folate No results found for: VITAMINB12, FOLATE    The 10-year ASCVD risk score (Arnett DK, et al., 2019) is: 13%  Assessment & Plan:  Type 2 diabetes mellitus with hyperglycemia, without long-term current use of insulin (HCC) -     Mounjaro ; Inject 5 mg into the skin once a week.  Dispense: 2 mL; Refill: 0  Hypertension, unspecified type -     Telmisartan -HCTZ; Take 1 tablet by mouth daily.  Dispense: 90 tablet; Refill: 1  Hyperlipidemia associated with type 2 diabetes mellitus (HCC)  Assessment and Plan    Hypertension Blood pressure improved but remains at 140/90 mmHg. Goal is 120/80 mmHg. Home monitoring essential. - Increase telmisartan  dosage, continue hydrochlorothiazide at current dose. - Prescribe 90 tablets of telmisartan . - Advise purchasing a blood pressure cuff with memory. -  Monitor blood pressure at home 3-4 times a week. - Check blood pressure at least one hour after taking medication.  Type 2 Diabetes Mellitus Managed with tirzepatide . No side effects from initial dose. Increasing dose may enhance weight loss and glycemic control. - Continue tirzepatide  2.5 mg injection on June 17 (pt takes every Tuesday). - Increase tirzepatide  to 5 mg starting June 24. - Call the office in one month to report tolerability and weight loss. - Consider increasing tirzepatide  to 7.5 mg after 4  weeks if 5mg  well tolerated.  Hyperlipidemia Total cholesterol slightly above target, LDL significantly higher. Atorvastatin  expected to lower levels. - Continue atorvastatin . - Recheck cholesterol in three months.  General Health Maintenance Previous screenings normal. Emphasized lifestyle modifications for health management. - Encourage lifestyle modifications including dietary changes and exercise.         Return in about 2 months (around 12/20/2023).   Mandy Second, PA

## 2023-11-09 ENCOUNTER — Other Ambulatory Visit: Payer: Self-pay

## 2023-11-09 DIAGNOSIS — I10 Essential (primary) hypertension: Secondary | ICD-10-CM

## 2023-11-09 DIAGNOSIS — E1165 Type 2 diabetes mellitus with hyperglycemia: Secondary | ICD-10-CM

## 2023-11-09 MED ORDER — MOUNJARO 5 MG/0.5ML ~~LOC~~ SOAJ: 5.0000 mg | SUBCUTANEOUS | 0 refills | Status: DC

## 2023-11-09 MED ORDER — TELMISARTAN-HCTZ 80-12.5 MG PO TABS: 1.0000 | ORAL_TABLET | Freq: Every day | ORAL | 1 refills | Status: AC

## 2023-11-11 ENCOUNTER — Ambulatory Visit: Admitting: Medical-Surgical

## 2023-11-11 ENCOUNTER — Encounter: Payer: Self-pay | Admitting: Medical-Surgical

## 2023-11-11 ENCOUNTER — Telehealth: Payer: Self-pay

## 2023-11-11 NOTE — Telephone Encounter (Signed)
 Patient came in to our office stating he had a scheduled appointment with a provider here for 10:30 but his appointment was at [facility] location. He was informed he would need to do a transfer of care appointment but no available openings for today. He stated he has been coming here for the past 8 years. His last appointment was with [place] but she is no longer at this office. He kept repeating he had an appointment here and this is bullsh**, advising I should be figuring out what happened instead of arguing with him. Advised patient I could no longer assist him if he could not refrain from cussing. He took my name down, asked to speak with the manager. He was given the card with manger's information. He then asked for my last name and CMA declined. Patient walked out of the office.

## 2023-11-25 ENCOUNTER — Encounter: Payer: Self-pay | Admitting: Urgent Care

## 2023-12-31 ENCOUNTER — Other Ambulatory Visit: Payer: Self-pay | Admitting: Urgent Care

## 2023-12-31 ENCOUNTER — Telehealth: Payer: Self-pay | Admitting: Urgent Care

## 2023-12-31 DIAGNOSIS — E1165 Type 2 diabetes mellitus with hyperglycemia: Secondary | ICD-10-CM

## 2023-12-31 NOTE — Telephone Encounter (Signed)
 Copied from CRM #[id number]. Topic: Clinical - Medication Refill >> Dec 31, 2023  3:50 PM Susanna ORN wrote: Medication: tirzepatide  (MOUNJARO ) 5 MG/0.5ML Pen  Has the patient contacted their pharmacy? No (Agent: If no, request that the patient contact the pharmacy for the refill. If patient does not wish to contact the pharmacy document the reason why and proceed with request.) (Agent: If yes, when and what did the pharmacy advise?)  This is the patient's preferred pharmacy:  [id number] - [place], [place] - [address] [place] [address] Phone: [phone] Fax: [phone]   Is this the correct pharmacy for this prescription? Yes If no, delete pharmacy and type the correct one.   Has the prescription been filled recently? Yes  Is the patient out of the medication? Yes, hasn't taken medication in 2 days.  Has the patient been seen for an appointment in the last year OR does the patient have an upcoming appointment? Yes  Can we respond through MyChart? No  Agent: Please be advised that Rx refills may take up to 3 business days. We ask that you follow-up with your pharmacy.

## 2023-12-31 NOTE — Telephone Encounter (Signed)
 Patient called again. He has changed pharmacies due to insurance reasons. Please send script to [facility].     >> Dec 31, 2023  3:50 PM Susanna ORN wrote: Medication: tirzepatide  (MOUNJARO ) 5 MG/0.5ML Pen   Has the patient contacted their pharmacy? No (Agent: If no, request that the patient contact the pharmacy for the refill. If patient does not wish to contact the pharmacy document the reason why and proceed with request.) (Agent: If yes, when and what did the pharmacy advise?)   This is the patient's preferred pharmacy:  [id number] - [place], [place] - [address] [place] [address] Phone: [phone] Fax: [phone]

## 2023-12-31 NOTE — Telephone Encounter (Signed)
 Patient called again. He has changed pharmacies due to insurance reasons. Please send script to [facility].

## 2024-01-01 ENCOUNTER — Other Ambulatory Visit: Payer: Self-pay | Admitting: Urgent Care

## 2024-01-01 DIAGNOSIS — E1165 Type 2 diabetes mellitus with hyperglycemia: Secondary | ICD-10-CM

## 2024-01-01 DIAGNOSIS — E1169 Type 2 diabetes mellitus with other specified complication: Secondary | ICD-10-CM

## 2024-01-03 MED ORDER — MOUNJARO 5 MG/0.5ML ~~LOC~~ SOAJ: 5.0000 mg | SUBCUTANEOUS | 0 refills | Status: DC

## 2024-01-03 NOTE — Telephone Encounter (Signed)
 Sent prescription to [facility].

## 2024-01-03 NOTE — Addendum Note (Signed)
 Addended by: BONNY JON DEL on: 01/03/2024 11:03 AM   Modules accepted: Orders

## 2024-01-18 ENCOUNTER — Encounter: Payer: Self-pay | Admitting: Urgent Care

## 2024-01-18 ENCOUNTER — Ambulatory Visit (INDEPENDENT_AMBULATORY_CARE_PROVIDER_SITE_OTHER): Admitting: Urgent Care

## 2024-01-18 VITALS — BP 132/82 | HR 77 | Ht 67.0 in | Wt 278.0 lb

## 2024-01-18 DIAGNOSIS — I10 Essential (primary) hypertension: Secondary | ICD-10-CM | POA: Diagnosis not present

## 2024-01-18 DIAGNOSIS — E1169 Type 2 diabetes mellitus with other specified complication: Secondary | ICD-10-CM

## 2024-01-18 DIAGNOSIS — E785 Hyperlipidemia, unspecified: Secondary | ICD-10-CM

## 2024-01-18 DIAGNOSIS — R0683 Snoring: Secondary | ICD-10-CM

## 2024-01-18 DIAGNOSIS — Z1211 Encounter for screening for malignant neoplasm of colon: Secondary | ICD-10-CM

## 2024-01-18 DIAGNOSIS — E1165 Type 2 diabetes mellitus with hyperglycemia: Secondary | ICD-10-CM

## 2024-01-18 DIAGNOSIS — Z6841 Body Mass Index (BMI) 40.0 and over, adult: Secondary | ICD-10-CM

## 2024-01-18 NOTE — Patient Instructions (Signed)
 We updated your labs today. Please continue all medications as ordered. We will contact you with lab results to determine if any changes are needed.  Will send home sleep request to [facility] to see if this can be completed more economically.  Return in 4 months, sooner as needed. Continue with the healthy lifestyle changes.

## 2024-01-18 NOTE — Progress Notes (Signed)
 Established Patient Office Visit  Subjective:  Patient ID: Cameron Mccoy, male    DOB: 24-Jun-1970  Age: 53 y.o. MRN: [id number]  Chief Complaint  Patient presents with   Medical Management of Chronic Issues   Hypertension   Hyperlipidemia    HPI  Discussed the use of AI scribe software for clinical note transcription with the patient, who gave verbal consent to proceed.  History of Present Illness   Cameron Mccoy is a 53 year old male with type 2 diabetes and hypertension who presents for follow-up regarding weight management and medication review.  He engages in regular physical activity, including daily workouts in his garage using a [employer]flex Revolution, dumbbells, and kettlebells. His routine includes exercises such as squats, lunges, bench press, bicep curls, and rows, along with a farmer's walk on a slope in his driveway. Despite this increased physical activity, he has not observed significant weight loss.  He is currently on Mounjaro  5 mg for diabetes management and does not check his blood sugars at home. His last A1c was 7.0 three and a half months ago, and he is awaiting current lab results to assess any changes.  He takes telmisartan  hydrochlorothiazide for hypertension and adheres to his medication regimen. He does not monitor his blood pressure at home but avoids high salt and sugar intake, has quit alcohol, and rarely consumes sodas.  He is also on atorvastatin  for cholesterol management and reports adherence to his medication regimen. He fasted prior to the visit for lab work to check his cholesterol levels.  He has not undergone a sleep study due to cost concerns. He mentions a change in insurance affecting his pharmacy options.  He prefers exercising at home due to concerns about gym environments and potential infections. He engages in yard work and gardening, which he finds therapeutic and physically engaging.  No dyspnea on exertion, and he reports having  more energy.       Patient Active Problem List   Diagnosis Date Noted   Elevated blood-pressure reading without diagnosis of hypertension 06/11/2017   Acute pain of left shoulder 06/11/2017   Past Medical History:  Diagnosis Date   Attention deficit disorder (ADD) in adult    was on vyvanse for 3 yrs, stopped approx 2016.  Dr. Elouise at [facility].   Hypercholesterolemia 05/2019   Fram 10 yr= 10 %-->pt declined statin.   Hypertension 05/2019   Morbid obesity (HCC)    Prediabetes 12/2016   Fasting gluc @120  x 2, A1c 6.2%.--nutritionist consult recommended.  A1c 6.5%-->pt [facility] nutritionist 12/2017.   History reviewed. No pertinent surgical history. Social History   Tobacco Use   Smoking status: Never   Smokeless tobacco: Never  Vaping Use   Vaping status: Never Used  Substance Use Topics   Alcohol use: No   Drug use: No      ROS: as noted in HPI  Objective:     BP 132/82   Pulse 77   Ht 5' 7 (1.702 m)   Wt 278 lb (126.1 kg)   SpO2 94%   BMI 43.54 kg/m  BP Readings from Last 3 Encounters:  01/18/24 132/82  10/20/23 (!) 146/86  09/20/23 (!) 168/118   Wt Readings from Last 3 Encounters:  01/18/24 278 lb (126.1 kg)  10/20/23 280 lb 12.8 oz (127.4 kg)  09/20/23 281 lb (127.5 kg)      Physical Exam Vitals and nursing note reviewed.  Constitutional:      General: He is  not in acute distress.    Appearance: Normal appearance. He is not ill-appearing, toxic-appearing or diaphoretic.  HENT:     Head: Normocephalic and atraumatic.     Right Ear: External ear normal.     Left Ear: External ear normal.     Mouth/Throat:     Mouth: Mucous membranes are moist.     Pharynx: No oropharyngeal exudate or posterior oropharyngeal erythema.  Eyes:     General: No scleral icterus.       Right eye: No discharge.        Left eye: No discharge.     Extraocular Movements: Extraocular movements intact.     Pupils: Pupils are equal, round, and reactive  to light.  Cardiovascular:     Rate and Rhythm: Normal rate and regular rhythm.     Heart sounds: No murmur heard. Pulmonary:     Effort: Pulmonary effort is normal. No respiratory distress.     Breath sounds: Normal breath sounds. No stridor. No wheezing or rhonchi.  Musculoskeletal:     Cervical back: Normal range of motion and neck supple. No rigidity or tenderness.  Lymphadenopathy:     Cervical: No cervical adenopathy.  Skin:    General: Skin is warm and dry.  Neurological:     General: No focal deficit present.     Mental Status: He is alert and oriented to person, place, and time.  Psychiatric:        Mood and Affect: Mood normal.        Behavior: Behavior normal.      No results found for any visits on 01/18/24.  Last CBC Lab Results  Component Value Date   WBC 5.0 09/20/2023   HGB 14.8 09/20/2023   HCT 44.8 09/20/2023   MCV 86.5 09/20/2023   RDW 14.0 09/20/2023   PLT 244.0 09/20/2023   Last metabolic panel Lab Results  Component Value Date   GLUCOSE 133 (H) 09/20/2023   NA 142 09/20/2023   K 3.8 09/20/2023   CL 101 09/20/2023   CO2 33 (H) 09/20/2023   BUN 13 09/20/2023   CREATININE 0.85 09/20/2023   GFR 99.61 09/20/2023   CALCIUM  9.2 09/20/2023   PROT 7.0 09/20/2023   ALBUMIN 4.3 09/20/2023   BILITOT 0.4 09/20/2023   ALKPHOS 76 09/20/2023   AST 17 09/20/2023   ALT 32 09/20/2023   Last lipids Lab Results  Component Value Date   CHOL 207 (H) 09/20/2023   HDL 48.30 09/20/2023   LDLCALC 146 (H) 09/20/2023   TRIG 64.0 09/20/2023   CHOLHDL 4 09/20/2023   Last hemoglobin A1c Lab Results  Component Value Date   HGBA1C 7.0 (H) 09/20/2023   Last thyroid  functions Lab Results  Component Value Date   TSH 2.55 09/20/2023   Last vitamin D No results found for: 25OHVITD2, 25OHVITD3, VD25OH Last vitamin B12 and Folate No results found for: VITAMINB12, FOLATE    The 10-year ASCVD risk score (Arnett DK, et al., 2019) is:  19.7%  Assessment & Plan:  Hyperlipidemia associated with type 2 diabetes mellitus (HCC) -     Lipid panel  Type 2 diabetes mellitus with hyperglycemia, without long-term current use of insulin (HCC) -     Hemoglobin A1c -     Lipid panel -     Cortisol  Hypertension, unspecified type  BMI 40.0-44.9, adult (HCC) -     Hemoglobin A1c -     Lipid panel -     Cortisol  Snoring -     Home sleep test  Screen for colon cancer -     Ambulatory referral to Gastroenterology  Assessment and Plan    Type 2 diabetes mellitus Type 2 diabetes with previous A1c of 7.0, currently on Mounjaro  5 mg. A1c expected to decrease, minimal weight loss observed. - Recheck A1c today. - Consider increasing Mounjaro  to 7.5 mg if A1c is above 6.5. - Continue Mounjaro  5 mg if A1c is at or below goal.  Essential hypertension Hypertension managed with telmisartan  hydrochlorothiazide. Blood pressure at goal, adherent to medication and lifestyle changes. - Continue telmisartan  hydrochlorothiazide at current dose.  Obesity, BMI 40.0-44.9 Obesity with minimal weight loss despite increased activity and Mounjaro . Engaging in exercise and dietary changes. - Check cortisol level.  Hyperlipidemia Hyperlipidemia managed with atorvastatin , adherent to regimen. - Recheck cholesterol levels today.  Suspected sleep apnea Suspected sleep apnea, previous study unaffordable. New referral to assess insurance coverage. - Send new referral for home sleep study to [facility]. - Proceed with sleep study if covered 100% by insurance.         Return in about 4 months (around 05/19/2024).   Benton LITTIE Gave, PA

## 2024-01-19 ENCOUNTER — Ambulatory Visit: Payer: Self-pay | Admitting: Urgent Care

## 2024-01-19 DIAGNOSIS — E1169 Type 2 diabetes mellitus with other specified complication: Secondary | ICD-10-CM

## 2024-01-19 LAB — HEMOGLOBIN A1C
Est. average glucose Bld gHb Est-mCnc: 128 mg/dL
Hgb A1c MFr Bld: 6.1 % — ABNORMAL HIGH (ref 4.8–5.6)

## 2024-01-19 LAB — LIPID PANEL
Chol/HDL Ratio: 4.4 ratio (ref 0.0–5.0)
Cholesterol, Total: 184 mg/dL (ref 100–199)
HDL: 42 mg/dL (ref >39)
LDL Chol Calc (NIH): 129 mg/dL — ABNORMAL HIGH (ref 0–99)
Triglycerides: 70 mg/dL (ref 0–149)
VLDL Cholesterol Cal: 13 mg/dL (ref 5–40)

## 2024-01-19 LAB — CORTISOL: Cortisol: 7.6 ug/dL (ref 6.2–19.4)

## 2024-01-19 MED ORDER — ATORVASTATIN CALCIUM 20 MG PO TABS: 20.0000 mg | ORAL_TABLET | Freq: Every day | ORAL | 3 refills | Status: AC

## 2024-02-02 ENCOUNTER — Other Ambulatory Visit: Payer: Self-pay | Admitting: Urgent Care

## 2024-02-02 ENCOUNTER — Other Ambulatory Visit: Payer: Self-pay

## 2024-02-02 DIAGNOSIS — E1165 Type 2 diabetes mellitus with hyperglycemia: Secondary | ICD-10-CM

## 2024-02-02 NOTE — Telephone Encounter (Signed)
 Pt called office upset that Rx: Mounjaro  5 mg was not sent into pharmacy.  Call dropped while patient was waiting on hold. Left message on patients voicemail updating him that Rx has been refilled and sent to [facility].

## 2024-02-02 NOTE — Telephone Encounter (Signed)
 Did we send it to the wrong place or did the pharmacy screw up? As long as he got the medications

## 2024-02-02 NOTE — Telephone Encounter (Signed)
 Hey Whitney,   Crystal sent in the refill for me. Patient was upset that he was out of his medication and he was told by [facility] that Rx was transferred to [facility] pharmacy. When he arrived at [facility], the Rx wasn't there. So he was upset that he drove 45 minutes to the pharmacy.

## 2024-02-23 ENCOUNTER — Other Ambulatory Visit: Payer: Self-pay | Admitting: Urgent Care

## 2024-02-23 DIAGNOSIS — E1165 Type 2 diabetes mellitus with hyperglycemia: Secondary | ICD-10-CM

## 2024-02-23 NOTE — Telephone Encounter (Unsigned)
 Copied from CRM [id number]. Topic: Clinical - Medication Refill >> Feb 23, 2024  2:48 PM Cameron Mccoy wrote: Medication: MOUNJARO  5 MG/0.5ML Pen  Has the patient contacted their pharmacy? No, no refill    This is the patient's preferred pharmacy:  [id number] [place], [place] - [address] [place] [address] [place] [address] [place] [address] Phone: [phone] Fax: [phone]  Is this the correct pharmacy for this prescription? Yes   Has the prescription been filled recently? No  Is the patient out of the medication? Yes  Has the patient been seen for an appointment in the last year OR does the patient have an upcoming appointment? Yes  Can we respond through MyChart? No  Agent: Please be advised that Rx refills may take up to 3 business days. We ask that you follow-up with your pharmacy.

## 2024-02-24 MED ORDER — MOUNJARO 5 MG/0.5ML ~~LOC~~ SOAJ: 5.0000 mg | SUBCUTANEOUS | 5 refills | Status: DC

## 2024-02-24 NOTE — Telephone Encounter (Signed)
 Sending to  PCP

## 2024-03-22 ENCOUNTER — Other Ambulatory Visit: Payer: Self-pay | Admitting: Urgent Care

## 2024-03-22 DIAGNOSIS — E1165 Type 2 diabetes mellitus with hyperglycemia: Secondary | ICD-10-CM

## 2024-03-22 DIAGNOSIS — I10 Essential (primary) hypertension: Secondary | ICD-10-CM

## 2024-03-22 NOTE — Telephone Encounter (Signed)
 Copied from CRM [id number]. Topic: Clinical - Medication Refill >> Mar 22, 2024  1:29 PM Emylou G wrote: Medication: atorvastatin  (LIPITOR) 20 MG tablet telmisartan -hydrochlorothiazide (MICARDIS  HCT) 80-12.5 MG tablet tirzepatide  (MOUNJARO ) 5 MG/0.5ML Pen    Has the patient contacted their pharmacy? No (Agent: If no, request that the patient contact the pharmacy for the refill. If patient does not wish to contact the pharmacy document the reason why and proceed with request.) (Agent: If yes, when and what did the pharmacy advise?)  This is the patient's preferred pharmacy:    [facility] - [address], [facility] [place] [address] Phone: [phone] Fax: [phone]  Is this the correct pharmacy for this prescription? Yes If no, delete pharmacy and type the correct one.   Has the prescription been filled recently? No  Is the patient out of the medication? Yes  Has the patient been seen for an appointment in the last year OR does the patient have an upcoming appointment? Yes  Can we respond through MyChart? No  Agent: Please be advised that Rx refills may take up to 3 business days. We ask that you follow-up with your pharmacy.

## 2024-05-19 ENCOUNTER — Ambulatory Visit: Admitting: Urgent Care

## 2024-05-22 ENCOUNTER — Telehealth: Payer: Self-pay

## 2024-05-22 NOTE — Telephone Encounter (Signed)
 Copied from CRM #[id number]. Topic: General - Other >> May 22, 2024  2:08 PM Willma R wrote: Reason for CRM: Scheduled patient for a 4 month follow up on 01/16 and he wants to know if he will be having labs done at his visit and if he needs to fast. Is requesting a callback.  Patient can be reached at [phone]

## 2024-05-22 NOTE — Telephone Encounter (Signed)
 Yes, please have pt fast

## 2024-05-23 NOTE — Telephone Encounter (Signed)
 Attempted  to contact patient by phone. Voicemail box was full - could not leave a message.

## 2024-05-23 NOTE — Telephone Encounter (Signed)
 Patient informed.

## 2024-05-25 ENCOUNTER — Ambulatory Visit: Payer: Self-pay | Admitting: Urgent Care

## 2024-05-25 ENCOUNTER — Encounter: Payer: Self-pay | Admitting: Urgent Care

## 2024-05-25 VITALS — BP 122/86 | HR 61 | Ht 68.0 in | Wt 262.0 lb

## 2024-05-25 DIAGNOSIS — R5383 Other fatigue: Secondary | ICD-10-CM | POA: Diagnosis not present

## 2024-05-25 DIAGNOSIS — E785 Hyperlipidemia, unspecified: Secondary | ICD-10-CM | POA: Diagnosis not present

## 2024-05-25 DIAGNOSIS — R634 Abnormal weight loss: Secondary | ICD-10-CM

## 2024-05-25 DIAGNOSIS — R632 Polyphagia: Secondary | ICD-10-CM

## 2024-05-25 DIAGNOSIS — I10 Essential (primary) hypertension: Secondary | ICD-10-CM | POA: Diagnosis not present

## 2024-05-25 DIAGNOSIS — Z6841 Body Mass Index (BMI) 40.0 and over, adult: Secondary | ICD-10-CM | POA: Diagnosis not present

## 2024-05-25 DIAGNOSIS — E1165 Type 2 diabetes mellitus with hyperglycemia: Secondary | ICD-10-CM | POA: Insufficient documentation

## 2024-05-25 DIAGNOSIS — R0683 Snoring: Secondary | ICD-10-CM

## 2024-05-25 DIAGNOSIS — E1169 Type 2 diabetes mellitus with other specified complication: Secondary | ICD-10-CM | POA: Diagnosis not present

## 2024-05-25 LAB — POCT UA - MICROALBUMIN
Albumin/Creatinine Ratio, Urine, POC: <30
Creatinine, POC: 200 mg/dL
Microalbumin Ur, POC: 30 mg/L

## 2024-05-25 LAB — POCT GLYCOSYLATED HEMOGLOBIN (HGB A1C): Hemoglobin A1C: 6.2 % — AB (ref 4.0–5.6)

## 2024-05-25 MED ORDER — NALTREXONE HCL 50 MG PO TABS: ORAL_TABLET | ORAL | 2 refills | Status: DC

## 2024-05-25 MED ORDER — TIRZEPATIDE 7.5 MG/0.5ML ~~LOC~~ SOAJ: 7.5000 mg | SUBCUTANEOUS | 5 refills | Status: DC

## 2024-05-25 MED ORDER — NALTREXONE HCL 50 MG PO TABS: ORAL_TABLET | ORAL | 2 refills | Status: AC

## 2024-05-25 MED ORDER — TIRZEPATIDE 7.5 MG/0.5ML ~~LOC~~ SOAJ: 7.5000 mg | SUBCUTANEOUS | 5 refills | Status: AC

## 2024-05-25 NOTE — Progress Notes (Signed)
 "  Established Patient Office Visit  Subjective:  Patient ID: Cameron Mccoy, male    DOB: Jan 15, 1971  Age: 54 y.o. MRN: [id number]  Chief Complaint  Patient presents with   Medical Management of Chronic Issues    Eros presents today for routine follow up on HTN, DM, HLD. BP has improved significantly. Tolerating medications. A1C stable at 6.2% (6.1% 4 months ago). Pt is discouraged however as he is trying to lose weight. Reports he has changed his diet drastically. For breakfast he has two slices of toast with peanut butter and coffee. He has cut out excess sugars and drinks primarily water. States that he always feels hungry which is of concern. He works out for one hour every morning, both weight lifting and cardio. He is on 5mg  weekly of Mounjaro  and is frustrated with his difficulty in losing weight. We had discussed sleep apnea testing a while back, but this ultimately was not completed due to the cost his insurance company was wanting him to pay up front. Pt is willing to revisit this, possibly doing an in office test if better covered by insurance.  Pt is otherwise doing well with no acute complaints today and is in need of med refills.  He would like to check his testosterone levels at some point.    Patient Active Problem List   Diagnosis Date Noted   Hypertension 05/25/2024   Type 2 diabetes mellitus with hyperglycemia, without long-term current use of insulin (HCC) 05/25/2024   Hyperlipidemia associated with type 2 diabetes mellitus (HCC) 05/25/2024   Elevated blood-pressure reading without diagnosis of hypertension 06/11/2017   Acute pain of left shoulder 06/11/2017   Past Medical History:  Diagnosis Date   Attention deficit disorder (ADD) in adult    was on vyvanse for 3 yrs, stopped approx 2016.  Dr. Elouise at [facility] Attention [facility].   Hypercholesterolemia 05/2019   Fram 10 yr= 10 %-->pt declined statin.   Hypertension 05/2019   Morbid obesity (HCC)     Prediabetes 12/2016   Fasting gluc @120  x 2, A1c 6.2%.--nutritionist consult recommended.  A1c 6.5%-->pt [facility] nutritionist 12/2017.   History reviewed. No pertinent surgical history. Social History[1]    ROS: as noted in HPI  Objective:     BP 122/86   Pulse 61   Ht 5' 8 (1.727 m)   Wt 262 lb (118.8 kg)   SpO2 99%   BMI 39.84 kg/m  BP Readings from Last 3 Encounters:  05/25/24 122/86  01/18/24 132/82  10/20/23 (!) 146/86   Wt Readings from Last 3 Encounters:  05/25/24 262 lb (118.8 kg)  01/18/24 278 lb (126.1 kg)  10/20/23 280 lb 12.8 oz (127.4 kg)      Physical Exam Vitals and nursing note reviewed.  Constitutional:      General: He is not in acute distress.    Appearance: Normal appearance. He is not ill-appearing, toxic-appearing or diaphoretic.  HENT:     Head: Normocephalic and atraumatic.     Right Ear: External ear normal.     Left Ear: External ear normal.     Mouth/Throat:     Mouth: Mucous membranes are moist.     Pharynx: No oropharyngeal exudate or posterior oropharyngeal erythema.  Eyes:     General: No scleral icterus.       Right eye: No discharge.        Left eye: No discharge.     Extraocular Movements: Extraocular movements intact.     Pupils:  Pupils are equal, round, and reactive to light.  Cardiovascular:     Rate and Rhythm: Normal rate and regular rhythm.     Heart sounds: No murmur heard. Pulmonary:     Effort: Pulmonary effort is normal. No respiratory distress.     Breath sounds: Normal breath sounds. No stridor. No wheezing or rhonchi.  Musculoskeletal:     Cervical back: Normal range of motion and neck supple. No rigidity or tenderness.  Lymphadenopathy:     Cervical: No cervical adenopathy.  Skin:    General: Skin is warm and dry.  Neurological:     General: No focal deficit present.     Mental Status: He is alert and oriented to person, place, and time.  Psychiatric:        Mood and Affect: Mood normal.        Behavior:  Behavior normal.      Results for orders placed or performed in visit on 05/25/24  POCT HgB A1C  Result Value Ref Range   Hemoglobin A1C 6.2 (A) 4.0 - 5.6 %   HbA1c POC (<> result, manual entry)     HbA1c, POC (prediabetic range)     HbA1c, POC (controlled diabetic range)    POCT UA - Microalbumin  Result Value Ref Range   Microalbumin Ur, POC 30 mg/L   Creatinine, POC 200 mg/dL   Albumin/Creatinine Ratio, Urine, POC <30     Last CBC Lab Results  Component Value Date   WBC 5.0 09/20/2023   HGB 14.8 09/20/2023   HCT 44.8 09/20/2023   MCV 86.5 09/20/2023   RDW 14.0 09/20/2023   PLT 244.0 09/20/2023   Last metabolic panel Lab Results  Component Value Date   GLUCOSE 133 (H) 09/20/2023   NA 142 09/20/2023   K 3.8 09/20/2023   CL 101 09/20/2023   CO2 33 (H) 09/20/2023   BUN 13 09/20/2023   CREATININE 0.85 09/20/2023   GFR 99.61 09/20/2023   CALCIUM  9.2 09/20/2023   PROT 7.0 09/20/2023   ALBUMIN 4.3 09/20/2023   BILITOT 0.4 09/20/2023   ALKPHOS 76 09/20/2023   AST 17 09/20/2023   ALT 32 09/20/2023   Last lipids Lab Results  Component Value Date   CHOL 184 01/18/2024   HDL 42 01/18/2024   LDLCALC 129 (H) 01/18/2024   TRIG 70 01/18/2024   CHOLHDL 4.4 01/18/2024   Last hemoglobin A1c Lab Results  Component Value Date   HGBA1C 6.2 (A) 05/25/2024   Last thyroid  functions Lab Results  Component Value Date   TSH 2.55 09/20/2023   Last vitamin D No results found for: 25OHVITD2, 25OHVITD3, VD25OH Last vitamin B12 and Folate No results found for: VITAMINB12, FOLATE    The 10-year ASCVD risk score (Arnett DK, et al., 2019) is: 17.3%  Assessment & Plan:  Hypertension, unspecified type -     CMP14+EGFR -     Ambulatory referral to Sleep Studies  Type 2 diabetes mellitus with hyperglycemia, without long-term current use of insulin (HCC) -     POCT glycosylated hemoglobin (Hb A1C) -     POCT UA - Microalbumin -     Tirzepatide ; Inject 7.5 mg into  the skin once a week.  Dispense: 6 mL; Refill: 5 -     Tirzepatide ; Inject 7.5 mg into the skin once a week.  Dispense: 6 mL; Refill: 5  Hyperlipidemia associated with type 2 diabetes mellitus (HCC) -     Lipid panel  Other fatigue -  Testosterone,Free and Total  Always hungry -     Naltrexone  HCl; Cut tablet into 1/4 and take 12.5mg  daily  Dispense: 15 tablet; Refill: 2 -     Naltrexone  HCl; Cut tablet into 1/4 and take 12.5mg  daily  Dispense: 15 tablet; Refill: 2  Snoring -     Ambulatory referral to Sleep Studies  BMI 40.0-44.9, adult [facility]) -     Ambulatory referral to Sleep Studies  Weight loss   Despite his frustration, Cameron Mccoy has lost 18# since June of last year. He has stabilized his blood sugars and BP has improved. We discussed revisiting the sleep study, but will place referral to sleep medicine. Pt states the home test was >$400 up front and he could not afford this.   Lab slip was printed, he will return between 8-10am to have his fasting labs checked.  We will remain on the same BP and cholesterol medication. Increase tirzepatide  to 7.5mg  weekly. Will add 12.5mg  naltrexone  to see if this can't stop some of his cravings and induce satiety.   Pt will continue with dietary changes, but would like to speak with [facility] for added nutritional support and advice. Referral was placed.   Pt is due for colonoscopy, referral has been made in Sept 2025. Pt needs to call and schedule.   Return in about 4 months (around 09/22/2024).   Cameron LITTIE Gave, PA     [1]  Social History Tobacco Use   Smoking status: Never   Smokeless tobacco: Never  Vaping Use   Vaping status: Never Used  Substance Use Topics   Alcohol use: No   Drug use: No   "

## 2024-05-25 NOTE — Patient Instructions (Signed)
 Please follow up with sleep medicine to discuss sleep apnea testing.  Increase mounjaro  to 7.5mg  weekly Add 1/4 tab naltrexone . I will place a referral to [facility] to discuss dietary practices  Please return between 8-10am for fasting labs.

## 2024-05-26 ENCOUNTER — Ambulatory Visit: Payer: Self-pay | Admitting: Urgent Care

## 2024-05-26 ENCOUNTER — Encounter: Payer: Self-pay | Admitting: Urgent Care

## 2024-09-22 ENCOUNTER — Ambulatory Visit: Admitting: Urgent Care
